# Patient Record
Sex: Female | Born: 1975 | Race: White | Hispanic: No | Marital: Married | Smoking: Former smoker
Health system: Southern US, Community
[De-identification: ages and names within clinical notes are randomized; demographics above are authoritative.]

## PROBLEM LIST (undated history)

## (undated) DIAGNOSIS — R519 Headache, unspecified: Secondary | ICD-10-CM

## (undated) DIAGNOSIS — T4145XA Adverse effect of unspecified anesthetic, initial encounter: Secondary | ICD-10-CM

## (undated) DIAGNOSIS — R51 Headache: Secondary | ICD-10-CM

## (undated) DIAGNOSIS — Z973 Presence of spectacles and contact lenses: Secondary | ICD-10-CM

## (undated) DIAGNOSIS — T8859XA Other complications of anesthesia, initial encounter: Secondary | ICD-10-CM

## (undated) DIAGNOSIS — R0600 Dyspnea, unspecified: Secondary | ICD-10-CM

## (undated) DIAGNOSIS — E669 Obesity, unspecified: Secondary | ICD-10-CM

## (undated) DIAGNOSIS — R490 Dysphonia: Secondary | ICD-10-CM

## (undated) HISTORY — PX: KNEE SURGERY: SHX244

## (undated) HISTORY — DX: Obesity, unspecified: E66.9

## (undated) HISTORY — PX: CHOLECYSTECTOMY: SHX55

## (undated) HISTORY — PX: DILATION AND CURETTAGE OF UTERUS: SHX78

## (undated) HISTORY — PX: OTHER SURGICAL HISTORY: SHX169

---

## 2009-08-19 ENCOUNTER — Emergency Department (HOSPITAL_BASED_OUTPATIENT_CLINIC_OR_DEPARTMENT_OTHER): Admission: EM | Admit: 2009-08-19 | Discharge: 2009-08-19 | Payer: Self-pay | Admitting: Emergency Medicine

## 2009-08-19 ENCOUNTER — Ambulatory Visit: Payer: Self-pay | Admitting: Diagnostic Radiology

## 2010-10-08 ENCOUNTER — Ambulatory Visit (HOSPITAL_COMMUNITY): Admission: RE | Admit: 2010-10-08 | Discharge: 2010-10-08 | Payer: Self-pay | Admitting: Internal Medicine

## 2010-11-17 ENCOUNTER — Emergency Department (HOSPITAL_COMMUNITY)
Admission: EM | Admit: 2010-11-17 | Discharge: 2010-11-17 | Payer: Self-pay | Source: Home / Self Care | Admitting: Emergency Medicine

## 2010-12-01 ENCOUNTER — Ambulatory Visit: Payer: Self-pay | Admitting: Emergency Medicine

## 2010-12-01 DIAGNOSIS — J209 Acute bronchitis, unspecified: Secondary | ICD-10-CM | POA: Insufficient documentation

## 2010-12-01 DIAGNOSIS — J45909 Unspecified asthma, uncomplicated: Secondary | ICD-10-CM | POA: Insufficient documentation

## 2011-01-08 ENCOUNTER — Ambulatory Visit (HOSPITAL_COMMUNITY)
Admission: RE | Admit: 2011-01-08 | Discharge: 2011-01-08 | Payer: Self-pay | Source: Home / Self Care | Attending: General Surgery | Admitting: General Surgery

## 2011-01-08 LAB — CBC
Hemoglobin: 13.4 g/dL (ref 12.0–15.0)
MCH: 28.9 pg (ref 26.0–34.0)
MCHC: 32.4 g/dL (ref 30.0–36.0)
MCV: 89.2 fL (ref 78.0–100.0)
Platelets: 324 10*3/uL (ref 150–400)
RBC: 4.64 MIL/uL (ref 3.87–5.11)

## 2011-01-08 LAB — COMPREHENSIVE METABOLIC PANEL
Alkaline Phosphatase: 55 U/L (ref 39–117)
BUN: 14 mg/dL (ref 6–23)
Chloride: 105 mEq/L (ref 96–112)
Creatinine, Ser: 0.82 mg/dL (ref 0.4–1.2)
Glucose, Bld: 104 mg/dL — ABNORMAL HIGH (ref 70–99)
Potassium: 4.2 mEq/L (ref 3.5–5.1)
Total Bilirubin: 0.5 mg/dL (ref 0.3–1.2)

## 2011-01-08 LAB — DIFFERENTIAL
Eosinophils Absolute: 0.7 10*3/uL (ref 0.0–0.7)
Lymphs Abs: 2.6 10*3/uL (ref 0.7–4.0)
Monocytes Absolute: 0.9 10*3/uL (ref 0.1–1.0)
Monocytes Relative: 8 % (ref 3–12)
Neutrophils Relative %: 62 % (ref 43–77)

## 2011-01-08 LAB — URINALYSIS, ROUTINE W REFLEX MICROSCOPIC
Leukocytes, UA: NEGATIVE
Nitrite: NEGATIVE
Protein, ur: NEGATIVE mg/dL
Urobilinogen, UA: 1 mg/dL (ref 0.0–1.0)

## 2011-01-08 LAB — URINE MICROSCOPIC-ADD ON

## 2011-01-16 NOTE — Assessment & Plan Note (Signed)
Summary: COLD/TM Room 4   Vital Signs:  Patient Profile:   35 Years Old Female CC:      Chest congestion, wheezing, cough x 2 weeks Height:     62 inches Weight:      205 pounds O2 Sat:      94 % O2 treatment:    Room Air Temp:     99.2 degrees F oral Pulse rate:   95 / minute Pulse rhythm:   regular Resp:     16 per minute  Vitals Entered By: Emilio Math (December 01, 2010 2:00 PM)                  Current Allergies: ! CODEINE ! DOXYCYCLINEHistory of Present Illness Chief Complaint: Chest congestion, wheezing, cough x 2 weeks History of Present Illness: Patient complains of onset of cold symptoms for 2 weeks.  She went to her PCP and got Zpak and Albuterol that she has been taking via HFA and nebulizer.  She is feeling better but still feels tight in the chest.  In the past she has used Prednisone which has helped.  She is already on Advair and Singulair.  She is on Chantix and trying to quit smoking. No sore throat + cough No pleuritic pain + wheezing No nasal congestion + post-nasal drainage + hoarseness No sinus pain/pressure No chest congestion No itchy/red eyes No earache No hemoptysis No SOB No chills/sweats No fever No nausea No vomiting No abdominal pain No diarrhea No skin rashes No fatigue No myalgias No headache   REVIEW OF SYSTEMS Constitutional Symptoms      Denies fever, chills, night sweats, weight loss, weight gain, and fatigue.  Eyes       Denies change in vision, eye pain, eye discharge, glasses, contact lenses, and eye surgery. Ear/Nose/Throat/Mouth       Complains of frequent runny nose.      Denies hearing loss/aids, change in hearing, ear pain, ear discharge, dizziness, frequent nose bleeds, sinus problems, sore throat, hoarseness, and tooth pain or bleeding.  Respiratory       Complains of dry cough, wheezing, shortness of breath, asthma, and bronchitis.      Denies productive cough and emphysema/COPD.  Cardiovascular  Denies murmurs, chest pain, and tires easily with exhertion.    Gastrointestinal       Denies stomach pain, nausea/vomiting, diarrhea, constipation, blood in bowel movements, and indigestion. Genitourniary       Denies painful urination, kidney stones, and loss of urinary control. Neurological       Denies paralysis, seizures, and fainting/blackouts. Musculoskeletal       Denies muscle pain, joint pain, joint stiffness, decreased range of motion, redness, swelling, muscle weakness, and gout.  Skin       Denies bruising, unusual mles/lumps or sores, and hair/skin or nail changes.  Psych       Denies mood changes, temper/anger issues, anxiety/stress, speech problems, depression, and sleep problems.  Past History:  Past Medical History: Asthma  Past Surgical History: Rt Knee  Family History: Mother, Factor 5 father, D, Heart problems  Social History: 1 ppd, 20 yrs ETOyes NO Drugs [facility] Physical Exam General appearance: well developed, well nourished, no acute distress Ears: normal, no lesions or deformities Nasal: mucosa pink, nonedematous, no septal deviation, turbinates normal Oral/Pharynx: tongue normal, posterior pharynx without erythema or exudate Chest/Lungs: no rales, wheezes, or rhonchi bilateral, breath sounds equal without effort Heart: regular rate and  rhythm, no murmur MSE: oriented  to time, place, and person Assessment New Problems: BRONCHITIS, ACUTE WITH BRONCHOSPASM (ICD-466.0) ASTHMA (ICD-493.90)   Plan New Medications/Changes: ZITHROMAX Z-PAK 250 MG TABS (AZITHROMYCIN) use as directed  #1 x 0, 12/01/2010, Hoyt Koch MD PREDNISONE (PAK) 10 MG TABS (PREDNISONE) 12 day pack. use as directed  #QS x 0, 12/01/2010, Hoyt Koch MD  New Orders: New Patient Level III [phone] Pulse Oximetry [94760] Planning Comments:   Continue your current allergy meds.  It is ok to repeat the Zpak to ensure that she is improved, a Rx was given that she may  choose to hold on to for a few days to see if the Prednisone is helping first.  Also have given her a 12-day Prednisone taper.  Continue your HFA and nebulizer Albuterol as needed.  If still not improving, follow up with PCP or ENT.  Nasal saline and hydration encouraged.  Patient offered but declines albuterol treatment.   The patient and/or caregiver has been counseled thoroughly with regard to medications prescribed including dosage, schedule, interactions, rationale for use, and possible side effects and they verbalize understanding.  Diagnoses and expected course of recovery discussed and will return if not improved as expected or if the condition worsens. Patient and/or caregiver verbalized understanding.  Prescriptions: ZITHROMAX Z-PAK 250 MG TABS (AZITHROMYCIN) use as directed  #1 x 0   Entered and Authorized by:   Hoyt Koch MD   Signed by:   Hoyt Koch MD on 12/01/2010   Method used:   Print then Give to Patient   RxID:   [id number] PREDNISONE (PAK) 10 MG TABS (PREDNISONE) 12 day pack. use as directed  #QS x 0   Entered and Authorized by:   Hoyt Koch MD   Signed by:   Hoyt Koch MD on 12/01/2010   Method used:   Print then Give to Patient   RxID:   [id number]   Orders Added: 1)  New Patient Level III [53664] 2)  Pulse Oximetry [40347]

## 2011-01-21 NOTE — Op Note (Signed)
Julia Boyer, Julia Boyer            ACCOUNT NO.:  [id number]  MEDICAL RECORD NO.:  [id number]          PATIENT TYPE:  AMB  LOCATION:  SDS                          FACILITY:  [facility]  PHYSICIAN:  Almond Lint, MD       DATE OF BIRTH:  August 11, 1976  DATE OF PROCEDURE:  01/08/2011 DATE OF DISCHARGE:  01/08/2011                              OPERATIVE REPORT   PREOPERATIVE DIAGNOSIS:  Chronic cholecystitis.  POSTOPERATIVE DIAGNOSIS:  Chronic cholecystitis.  PROCEDURE PERFORMED:  Laparoscopic cholecystectomy.  SURGEON:  Almond Lint, MD  ASSISTANT:  None.  ANESTHESIA:  General and local.  FINDINGS:  Inflamed gallbladder wall and numerous omental attachments to the gallbladder.  SPECIMEN:  Gallbladder to Pathology.  ESTIMATED BLOOD LOSS:  Minimal.  COMPLICATIONS:  None known.  PROCEDURE:  Ms. [name] was identified in the holding area where she was taken to the operating room and placed supine on the operating room table.  General anesthesia was induced.  Abdomen was prepped and draped in sterile fashion.  Time-out was performed according to the surgical safety check list.  When all was correct, we continued.  The infraumbilical skin was anesthetized with local anesthetic, and her prior incision from her BTL was opened up with #11 blade.  This was approximately 15 mm in length.  A Kelly clamp was used to spread the subcutaneous tissues and the midline fascia was elevated with two Kocher clamps.  This was incised with a #11 blade.  A 0 Vicryl pursestring suture was placed around the fascial incision, and the Hasson trocar was then held in place to the abdominal wall with the tails of the suture. Pneumoperitoneum was achieved through the Hasson trocar.  The patient was then placed into reverse Trendelenburg position and rotated to the left.  An 11-mm trocar was placed into the epigastrium under direct visualization after administration of local and two 5-mm trocars were placed in  the right upper quadrant after administration of local.  The gallbladder fundus was grasped and elevated toward the head.  There were omental attachments to the liver that were restrictive and so these were taken down with cautery.  The omental attachments to the gallbladder were taken down both bluntly and with cautery, taking care to stay high up on the gallbladder.  Once the omental attachments were freed up, the fundus was elevated more toward the head and the infundibulum was retracted laterally.  The cystic duct and artery were skeletonized with Art gallery manager.  Alveolar attachments were taken with the cautery and a clear critical view was obtained visualizing two tubular structures directly entering the gallbladder of appropriate caliber from both medial and lateral aspect.  The cystic duct was triply clipped on the patient's side and clipped once on the specimen side.  The cystic artery was then clipped twice on the patient's side and once on the specimen side.  This was then divided with scissors.  The gallbladder was taken off the gallbladder fossa with the cautery.  The gallbladder was retrieved from the umbilical port with an Endocatch bag. Pneumoperitoneum was reachieved, and the gallbladder fossa was examined. There was no evidence of bleeding from  the gallbladder fossa.  The clips were examined, and there was no evidence of bleeding or biliary leakage from the site of the clips.  The area was irrigated and suctioned.  A four-quadrant inspection was performed and there was no evidence of pooling of bladder or other gross pathology in the abdomen.  The gallbladder fossa was reexamined and again there was no evidence of bleeding.  The two 5-mm trocars were removed under direct visualization without evidence of bleeding from the abdominal wall.  The epigastric port was removed while suctioning the pneumoperitoneum from the abdomen. The Hasson was opened up to allow the  remainder of the air to escape and the patient was placed back into the supine position.  The local fascia was closed with a pursestring suture and there was no residual or palpable fascial defect.  The skin of all the incisions was then closed using 4-0 Monocryl in subcuticular fashion.  The wounds were then cleaned, dried, and dressed with Dermabond.  The patient was awakened from anesthesia and taken to the PACU in stable condition.  Needle and sponge counts were correct.     Almond Lint, MD     FB/MEDQ  D:  01/08/2011  T:  01/09/2011  Job:  [id number]  cc:   Philis Pique. Reuel Boom, NP Dr. Noelle Penner.  Electronically Signed by Almond Lint MD on 01/21/2011 04:48:06 PM

## 2011-01-24 ENCOUNTER — Emergency Department (HOSPITAL_COMMUNITY): Payer: 59

## 2011-01-24 ENCOUNTER — Encounter: Payer: Self-pay | Admitting: Family Medicine

## 2011-01-24 ENCOUNTER — Emergency Department (HOSPITAL_COMMUNITY)
Admission: EM | Admit: 2011-01-24 | Discharge: 2011-01-24 | Disposition: A | Discharge status: Home / Self Care | Payer: 59 | Attending: Emergency Medicine | Admitting: Emergency Medicine

## 2011-01-24 ENCOUNTER — Ambulatory Visit (INDEPENDENT_AMBULATORY_CARE_PROVIDER_SITE_OTHER): Payer: PRIVATE HEALTH INSURANCE | Admitting: Family Medicine

## 2011-01-24 ENCOUNTER — Ambulatory Visit: Payer: Self-pay | Admitting: Family Medicine

## 2011-01-24 DIAGNOSIS — S2190XA Unspecified open wound of unspecified part of thorax, initial encounter: Secondary | ICD-10-CM | POA: Insufficient documentation

## 2011-01-24 DIAGNOSIS — J209 Acute bronchitis, unspecified: Secondary | ICD-10-CM

## 2011-01-24 DIAGNOSIS — R071 Chest pain on breathing: Secondary | ICD-10-CM | POA: Insufficient documentation

## 2011-01-24 DIAGNOSIS — J45909 Unspecified asthma, uncomplicated: Secondary | ICD-10-CM

## 2011-01-24 DIAGNOSIS — R000 Tachycardia, unspecified: Secondary | ICD-10-CM

## 2011-01-24 DIAGNOSIS — R51 Headache: Secondary | ICD-10-CM | POA: Insufficient documentation

## 2011-01-24 DIAGNOSIS — R0609 Other forms of dyspnea: Secondary | ICD-10-CM | POA: Insufficient documentation

## 2011-01-24 DIAGNOSIS — R0602 Shortness of breath: Secondary | ICD-10-CM

## 2011-01-24 DIAGNOSIS — R0989 Other specified symptoms and signs involving the circulatory and respiratory systems: Secondary | ICD-10-CM | POA: Insufficient documentation

## 2011-01-24 LAB — DIFFERENTIAL
Eosinophils Relative: 1 % (ref 0–5)
Lymphocytes Relative: 19 % (ref 12–46)
Lymphs Abs: 2.7 10*3/uL (ref 0.7–4.0)
Monocytes Absolute: 1.5 10*3/uL — ABNORMAL HIGH (ref 0.1–1.0)
Monocytes Relative: 11 % (ref 3–12)

## 2011-01-24 LAB — D-DIMER, QUANTITATIVE: D-Dimer, Quant: <0.22 ug/mL-FEU (ref 0.00–0.48)

## 2011-01-24 LAB — BASIC METABOLIC PANEL
CO2: 22 mEq/L (ref 19–32)
Calcium: 8.9 mg/dL (ref 8.4–10.5)
Chloride: 108 mEq/L (ref 96–112)
Creatinine, Ser: 0.75 mg/dL (ref 0.4–1.2)
GFR calc Af Amer: >60 mL/min (ref >60)
Sodium: 140 mEq/L (ref 135–145)

## 2011-01-24 LAB — CBC
HCT: 42 % (ref 36.0–46.0)
MCHC: 32.9 g/dL (ref 30.0–36.0)
MCV: 88.4 fL (ref 78.0–100.0)
Platelets: 379 10*3/uL (ref 150–400)
RDW: 13.9 % (ref 11.5–15.5)
WBC: 14.2 10*3/uL — ABNORMAL HIGH (ref 4.0–10.5)

## 2011-01-24 MED ORDER — IOHEXOL 300 MG/ML  SOLN
100.0000 mL | Freq: Once | INTRAMUSCULAR | Status: AC | PRN
  Administered 2011-01-24: 100 mL via INTRAVENOUS

## 2011-01-30 NOTE — Assessment & Plan Note (Signed)
Summary: HEART RACING/TJ room 5   Vital Signs:  Patient Profile:   35 Years Old Female CC:      heart racing Height:     62 inches O2 Sat:      97 % O2 treatment:    Room Air Temp:     98.1 degrees F oral Pulse rate:   107 / minute Resp:     18 per minute BP sitting:   158 / 105  (left arm) Cuff size:   regular  Vitals Entered By: Clemens Catholic LPN (January 24, 2011 3:35 PM)                 Serial Vital Signs/Assessments:  Time      Position  BP       Pulse  Resp  Temp     By 1520                138/87   119                   Christy Locklear LPN                                PEF    PreRx  PostRx Time      O2 Sat  O2 Type     L/min  L/min  L/min   By 1520      97  %   2 L/min                           Christy Locklear LPN   Updated Prior Medication List: ALBUTEROL SULFATE (2.5 MG/3ML) 0.083% NEBU (ALBUTEROL SULFATE)  SINGULAIR 10 MG TABS (MONTELUKAST SODIUM)   Current Allergies (reviewed today): ! CODEINE ! DOXYCYCLINEHistory of Present Illness Chief Complaint: heart racing History of Present Illness: Patient is a 35 year old W F who had GB ssurgery 3 weeks ago and started having increase SOB over the last 3 days. She has been using her inhaler more and today she not only had the shortnesss of breath she had tachycardia as well. She states her systoluic is usuallly 120 and today it is 150 and her pulse rates is in the 60s and today it has gi=one up to 130. She denies any chest pain or cardiac problems.    Her mother has a clotting disorder and has had PE's before.  Current Problems: DYSPNEA, UNSPEC (ICD-786.05) UNSPECIFIED TACHYCARDIA (ICD-785.0) BRONCHITIS, ACUTE WITH BRONCHOSPASM (ICD-466.0) ASTHMA (ICD-493.90)   Current Meds ALBUTEROL SULFATE (2.5 MG/3ML) 0.083% NEBU (ALBUTEROL SULFATE)  SINGULAIR 10 MG TABS (MONTELUKAST SODIUM)   REVIEW OF SYSTEMS Constitutional Symptoms      Denies fever, chills, night sweats, weight loss, weight gain, and fatigue.    Eyes       Denies change in vision, eye pain, eye discharge, glasses, contact lenses, and eye surgery. Ear/Nose/Throat/Mouth       Denies hearing loss/aids, change in hearing, ear pain, ear discharge, dizziness, frequent runny nose, frequent nose bleeds, sinus problems, sore throat, hoarseness, and tooth pain or bleeding.  Respiratory       Complains of shortness of breath and asthma.      Denies dry cough, productive cough, wheezing, bronchitis, and emphysema/COPD.  Cardiovascular       Denies murmurs, chest pain, and tires easily with exhertion.      Comments: palpatations   Gastrointestinal  Complains of diarrhea.      Denies stomach pain, nausea/vomiting, constipation, blood in bowel movements, and indigestion. Genitourniary       Denies painful urination, kidney stones, and loss of urinary control. Neurological       Complains of headaches.      Denies paralysis, seizures, and fainting/blackouts. Musculoskeletal       Denies muscle pain, joint pain, joint stiffness, decreased range of motion, redness, swelling, muscle weakness, and gout.  Skin       Denies bruising, unusual mles/lumps or sores, and hair/skin or nail changes.  Psych       Denies mood changes, temper/anger issues, anxiety/stress, speech problems, depression, and sleep problems. Other Comments: pt states that she had gallbladder surgery 3 wks ago, for the last 4-5 days she has been SOB. she states that her heart has been racing for the last 2-3 days, worse today. she states that her mom has a clotting defect and she had a PE after she had gallbladder surgery and her dad had a heart transplant @ age 61.   Past History:  Family History: Last updated: 12/01/2010 Mother, Factor 5 father, D, Heart problems  Social History: Last updated: 12/01/2010 1 ppd, 20 yrs ETOyes NO Drugs [facility]  Past Medical History: Reviewed history from 12/01/2010 and no changes required. Asthma  Past Surgical History: Rt  Knee Cholecystectomy  Family History: Reviewed history from 12/01/2010 and no changes required. Mother, Factor 5 father, D, Heart problems  Social History: Reviewed history from 12/01/2010 and no changes required. 1 ppd, 20 yrs ETOyes NO Drugs [facility] Physical Exam General appearance: well developed, well nourished, anxious Head: normocephalic, atraumatic Oral/Pharynx: tongue normal, posterior pharynx without erythema or exudate Neck: neck supple,  trachea midline, no masses Chest/Lungs: no rales, wheezes, or rhonchi bilateral, breath sounds equal without effort Heart: tachycardia was present   Skin: no obvious rashes or lesions MSE: oriented to time, place, and person carotid massage attempted but seemed to increase her rate Ekg showed tacycardia Assessment New Problems: DYSPNEA, UNSPEC (ICD-786.05) UNSPECIFIED TACHYCARDIA (ICD-785.0)  tachycardia and ypsnea  Plan New Orders: New Patient Level IV [99204] EKG w/ Interpretation [93000] Planning Comments:   transfer to [facility] per patient request to possible treat tachycardia and evaluate for PE   The patient and/or caregiver has been counseled thoroughly with regard to medications prescribed including dosage, schedule, interactions, rationale for use, and possible side effects and they verbalize understanding.  Diagnoses and expected course of recovery discussed and will return if not improved as expected or if the condition worsens. Patient and/or caregiver verbalized understanding.   Patient Instructions: 1)  Transfered to [facility] 2)  EMS transported  3)  report called to charge nurse at [facility] ED  Orders Added: 1)  New Patient Level IV [99204] 2)  EKG w/ Interpretation [93000]

## 2011-05-18 ENCOUNTER — Encounter: Payer: Self-pay | Admitting: Family Medicine

## 2011-05-18 ENCOUNTER — Inpatient Hospital Stay (INDEPENDENT_AMBULATORY_CARE_PROVIDER_SITE_OTHER)
Admission: RE | Admit: 2011-05-18 | Discharge: 2011-05-18 | Disposition: A | Discharge status: Home / Self Care | Payer: 59 | Source: Ambulatory Visit | Attending: Family Medicine | Admitting: Family Medicine

## 2011-05-18 DIAGNOSIS — J45909 Unspecified asthma, uncomplicated: Secondary | ICD-10-CM

## 2011-05-18 DIAGNOSIS — J209 Acute bronchitis, unspecified: Secondary | ICD-10-CM

## 2011-05-20 ENCOUNTER — Telehealth (INDEPENDENT_AMBULATORY_CARE_PROVIDER_SITE_OTHER): Payer: Self-pay | Admitting: Emergency Medicine

## 2011-11-17 NOTE — Progress Notes (Signed)
Summary: CONGESTION/EAR ISSUES(rm4)   Vital Signs:  Patient Profile:   35 Years Old Female CC:      chest congestion/ head cold Height:     62 inches Weight:      228 pounds O2 Sat:      93 % O2 treatment:    Room Air Temp:     98.0 degrees F oral Resp:     24 per minute BP sitting:   120 / 79  (left arm) Cuff size:   regular  Vitals Entered By: Linton Flemings RN (May 18, 2011 2:30 PM)                  Prior Medication List:  ALBUTEROL SULFATE (2.5 MG/3ML) 0.083% NEBU (ALBUTEROL SULFATE)  SINGULAIR 10 MG TABS (MONTELUKAST SODIUM)    Updated Prior Medication List: ALBUTEROL SULFATE (2.5 MG/3ML) 0.083% NEBU (ALBUTEROL SULFATE)   Current Allergies (reviewed today): ! CODEINE ! DOXYCYCLINEHistory of Present Illness Chief Complaint: chest congestion/ head cold History of Present Illness: Patient has had increase congestion and cough for about 2 weeks. She states in the last 4-5 days it has gotten worse. She does smoke and when she gets this bronchitis she usually needs a Zpack and steroids.  Current Problems: BRONCHITIS, ACUTE WITH BRONCHOSPASM (ICD-466.0) BRONCHITIS, ACUTE WITH BRONCHOSPASM (ICD-466.0) ASTHMA (ICD-493.90)   Current Meds ALBUTEROL SULFATE (2.5 MG/3ML) 0.083% NEBU (ALBUTEROL SULFATE)  ZITHROMAX Z-PAK 250 MG  TABS (AZITHROMYCIN) Use as directed PREDNISONE (PAK) 10 MG TABS (PREDNISONE) sig as directed over the next 6 days  REVIEW OF SYSTEMS Constitutional Symptoms      Denies fever, chills, night sweats, weight loss, weight gain, and fatigue.  Eyes       Denies change in vision, eye pain, eye discharge, glasses, contact lenses, and eye surgery. Ear/Nose/Throat/Mouth       Complains of ear pain, frequent runny nose, and hoarseness.      Denies hearing loss/aids, ear discharge, dizziness, frequent nose bleeds, sinus problems, sore throat, and tooth pain or bleeding.  Respiratory       Complains of wheezing, shortness of breath, asthma, and bronchitis.       Denies dry cough, productive cough, and emphysema/COPD.  Cardiovascular       Denies murmurs, chest pain, and tires easily with exhertion.    Gastrointestinal       Denies stomach pain, nausea/vomiting, diarrhea, constipation, blood in bowel movements, and indigestion. Genitourniary       Denies painful urination, kidney stones, and loss of urinary control. Neurological       Complains of headaches.      Denies paralysis, seizures, and fainting/blackouts. Musculoskeletal       Denies muscle pain, joint pain, joint stiffness, decreased range of motion, redness, swelling, muscle weakness, and gout.  Skin       Denies bruising, unusual mles/lumps or sores, and hair/skin or nail changes.  Psych       Denies mood changes, temper/anger issues, anxiety/stress, speech problems, depression, and sleep problems. Other Comments: x2wk with head/chest congestion with increased use of inhaler   Past History:  Family History: Last updated: 12/01/2010 Mother, Factor 5 father, D, Heart problems  Social History: Last updated: 12/01/2010 1 ppd, 20 yrs ETOyes NO Drugs [facility]  Past Medical History: Reviewed history from 12/01/2010 and no changes required. Asthma  Past Surgical History: Reviewed history from 01/24/2011 and no changes required. Rt Knee Cholecystectomy  Family History: Reviewed history from 12/01/2010 and no changes required.  Mother, Factor 5 father, D, Heart problems  Social History: Reviewed history from 12/01/2010 and no changes required. 1 ppd, 20 yrs ETOyes NO Drugs [facility] Physical Exam General appearance: well developed, well nourished, no acute distress Head: normocephalic, atraumatic Pupils: equal, round, reactive to light Ears: inflamed right TM Nasal: pale, boggy, swollen nasal turbinates Neck: neck supple,  trachea midline, no masses Chest/Lungs: scattered wheezes throughout all lobes Heart: regular rate and  rhythm, no murmur Extremities:  normal extremities Skin: no obvious rashes or lesions MSE: oriented to time, place, and person Assessment New Problems: BRONCHITIS, ACUTE WITH BRONCHOSPASM (ICD-466.0)  bronchitis   Patient Education: Patient and/or caregiver instructed in the following: rest fluids and Tylenol, quit smoking.  Plan New Medications/Changes: PREDNISONE (PAK) 10 MG TABS (PREDNISONE) sig as directed over the next 6 days  #21 x 0, 05/18/2011, Hassan Rowan MD ZITHROMAX Z-PAK 250 MG  TABS (AZITHROMYCIN) Use as directed  #1 x 0, 05/18/2011, Hassan Rowan MD  New Orders: Est. Patient Level III [phone] Planning Comments:   as below  Follow Up: Follow up in 2-3 days if no improvement, Follow up on an as needed basis, Follow up with Primary Physician Work/School Excuse: Return to work/school tomorrow  The patient and/or caregiver has been counseled thoroughly with regard to medications prescribed including dosage, schedule, interactions, rationale for use, and possible side effects and they verbalize understanding.  Diagnoses and expected course of recovery discussed and will return if not improved as expected or if the condition worsens. Patient and/or caregiver verbalized understanding.  Prescriptions: PREDNISONE (PAK) 10 MG TABS (PREDNISONE) sig as directed over the next 6 days  #21 x 0   Entered and Authorized by:   Hassan Rowan MD   Signed by:   Hassan Rowan MD on 05/18/2011   Method used:   Printed then faxed to ...       [address] [phone]* (retail)       [address] [place], [place]  [address]       Ph: [phone]       Fax: [phone]   RxID:   [id number] ZITHROMAX Z-PAK 250 MG  TABS (AZITHROMYCIN) Use as directed  #1 x 0   Entered and Authorized by:   Hassan Rowan MD   Signed by:   Hassan Rowan MD on 05/18/2011   Method used:   Printed then faxed to ...       [address] [phone]* (retail)       [address] [place], [place]  [address]       Ph: [phone]       Fax:  [phone]   RxID:   [id number]   Patient Instructions: 1)  Please schedule a follow-up appointment as needed. 2)  Please schedule an appointment with your primary doctor in :2-3 weeks 3)  Take your antibiotic as prescribed until ALL of it is gone, but stop if you develop a rash or swelling and contact our office as soon as possible. 4)  Acute bronchitis symptoms for less than 10 days are not helped by antibiotics. take over the counter cough medications. call if no improvment in  5-7 days, sooner if increasing cough, fever, or new symptoms( shortness of breath, chest pain). 5)  Tobacco is very bad for your health and your loved ones! You Should stop smoking!. 6)  Stop Smoking Tips: Choose a Quit date. Cut down  before the Quit date. decide what you will do as a substitute when you feel the urge to smoke(gum,toothpick,exercise).  Orders Added: 1)  Est. Patient Level III [16109]

## 2011-11-17 NOTE — Telephone Encounter (Signed)
  Phone Note Outgoing Call Call back at [facility] Phone [phone]   Call placed by: Emilio Math,  May 20, 2011 11:17 AM Call placed to: Patient Summary of Call: Left msg  hope she is feeling better, call with any questions or concerns

## 2012-03-01 ENCOUNTER — Encounter: Payer: Self-pay | Admitting: Physician Assistant

## 2012-03-01 ENCOUNTER — Ambulatory Visit (INDEPENDENT_AMBULATORY_CARE_PROVIDER_SITE_OTHER): Payer: 59 | Admitting: Physician Assistant

## 2012-03-01 VITALS — BP 113/77 | HR 91 | Temp 98.6°F | Resp 16 | Ht 62.0 in | Wt 222.0 lb

## 2012-03-01 DIAGNOSIS — N943 Premenstrual tension syndrome: Secondary | ICD-10-CM

## 2012-03-01 DIAGNOSIS — Z124 Encounter for screening for malignant neoplasm of cervix: Secondary | ICD-10-CM

## 2012-03-01 DIAGNOSIS — Z1151 Encounter for screening for human papillomavirus (HPV): Secondary | ICD-10-CM

## 2012-03-01 DIAGNOSIS — N6310 Unspecified lump in the right breast, unspecified quadrant: Secondary | ICD-10-CM

## 2012-03-01 DIAGNOSIS — F3281 Premenstrual dysphoric disorder: Secondary | ICD-10-CM

## 2012-03-01 DIAGNOSIS — N63 Unspecified lump in unspecified breast: Secondary | ICD-10-CM

## 2012-03-01 DIAGNOSIS — F17200 Nicotine dependence, unspecified, uncomplicated: Secondary | ICD-10-CM

## 2012-03-01 DIAGNOSIS — N920 Excessive and frequent menstruation with regular cycle: Secondary | ICD-10-CM

## 2012-03-01 DIAGNOSIS — Z01419 Encounter for gynecological examination (general) (routine) without abnormal findings: Secondary | ICD-10-CM

## 2012-03-01 MED ORDER — SERTRALINE HCL 50 MG PO TABS: 50.0000 mg | ORAL_TABLET | Freq: Every day | ORAL | Status: DC

## 2012-03-01 MED ORDER — HYDROCODONE-IBUPROFEN 5-200 MG PO TABS: 1.0000 | ORAL_TABLET | Freq: Three times a day (TID) | ORAL | Status: AC | PRN

## 2012-03-01 MED ORDER — SERTRALINE HCL 50 MG PO TABS: 50.0000 mg | ORAL_TABLET | Freq: Every day | ORAL | Status: AC

## 2012-03-01 MED ORDER — MEDROXYPROGESTERONE ACETATE 10 MG PO TABS: 10.0000 mg | ORAL_TABLET | Freq: Every day | ORAL | Status: AC

## 2012-03-01 NOTE — Progress Notes (Signed)
Chief Complaint:  Gynecologic Exam   Julia Boyer is  36 y.o. [id number].  Patient's last menstrual period was 02/09/2012.. She presents complaining of Gynecologic Exam  Reports LEEP for abnormal pap 10 years ago with no follow-up since. States periods regular and heavy with severe cramping, passing lime-sized clots. Increasing mood swings and anger during the 3-4 days prior to period with excessive crying.   Obstetrical/Gynecological History: Pertinent Gynecological History: Menses: flow is excessive with use of 15 pads or tampons on heaviest days Bleeding: heavy with large clots, no intramenstrual bleeding Contraception: tubal ligation DES exposure: denies Blood transfusions: none Sexually transmitted diseases: no past history Previous GYN Procedures: LEEP  Last mammogram: n/a  Last pap: abnormal: s/p LEEP Date: 10 years ago   Past Medical History: Past Medical History  Diagnosis Date  . Asthma     Past Surgical History: Past Surgical History  Procedure Date  . Cholecystectomy   . Knee surgery     Rt knee  arthroscopy  . Btl     Family History: Family History  Problem Relation Age of Onset  . Diabetes Maternal Grandmother   . Heart disease Father   . Pulmonary embolism Mother     Factor V    Social History: History  Substance Use Topics  . Smoking status: Current Everyday Smoker -- 1.0 packs/day for 20 years    Types: Cigarettes  . Smokeless tobacco: Never Used  . Alcohol Use: No    Allergies:  Allergies  Allergen Reactions  . Codeine Nausea And Vomiting  . Doxycycline Nausea And Vomiting     Review of Systems - Negative except what has been reviewed in HPI  Physical Exam   Blood pressure 113/77, pulse 91, temperature 98.6 F (37 C), temperature source Oral, resp. rate 16, height 5\' 2"  (1.575 m), weight 222 lb (100.699 kg), last menstrual period 02/09/2012.  General: General appearance - alert, well appearing, and in no distress, oriented to  person, place, and time and overweight Mental status - alert, oriented to person, place, and time, normal mood, behavior, speech, dress, motor activity, and thought processes, affect appropriate to mood Eyes - left eye normal, right eye normal Nose - normal and patent, no erythema, discharge or polyps Mouth - mucous membranes moist, pharynx normal without lesions Neck - supple, no significant adenopathy Lymphatics - no palpable lymphadenopathy, no hepatosplenomegaly Chest - clear to auscultation, no wheezes, rales or rhonchi, symmetric air entry Heart - normal rate, regular rhythm, normal S1, S2, no murmurs, rubs, clicks or gallops Abdomen - soft, nontender, nondistended, no masses or organomegaly Obese with irregular striae Breasts - left breast normal without mass, skin or nipple changes or axillary nodes, right breast with solid mass 2x3 cm at 6 o'clock at [employer] Back exam - full range of motion, no tenderness, palpable spasm or pain on motion Neurological - alert, oriented, normal speech, no focal findings or movement disorder noted, screening mental status exam normal, neck supple without rigidity, cranial nerves II through XII intact Musculoskeletal - no joint tenderness, deformity or swelling Focused Gynecological Exam: VULVA: normal appearing vulva with no masses, tenderness or lesions, VAGINA: normal appearing vagina with normal color and discharge, no lesions, CERVIX: normal appearing cervix without discharge or lesions, friable with pap collection, UTERUS: non tender, unable to appreciate size due to habitus, ADNEXA: normal adnexa in size, nontender and no masses   Assessment/Plan:   1. PMDD (premenstrual dysphoric disorder)  sertraline (ZOLOFT) 50 MG tablet, DISCONTINUED: sertraline (ZOLOFT)  50 MG tablet  2. Routine gynecological examination  Cytology - PAP, TSH, CBC, Comprehensive metabolic panel, Lipid panel  3. Breast mass, right  US Breast Right, MM Digital Diagnostic Bilat  4.  Menorrhagia  US Pelvis Complete, Ultrasound non-ob transvaginal, medroxyPROGESTERone (PROVERA) 10 MG tablet, hydrocodone-ibuprofen (VICOPROFEN) 5-200 MG per tablet  5. Smoker  Consult to tobacco cessation counselor ([facility] only)     RTC fasting for labs and in 3 weeks to review results and re-eval   Julia Boyer E. 03/01/2012,11:15 AM

## 2012-03-01 NOTE — Patient Instructions (Addendum)
Pap Test A Pap test is a sampling of cells from a woman's cervix. The cervix is the opening between the vagina (birth canal) and the uterus (the bottom part of the womb). The cells are scraped from the cervix during a pelvic exam. These cells are then looked at under a microscope to see if the cells are normal or to see if a cancer is developing or there are changes that suggest a cancer will develop. Cervical dysplasia is a condition in which a woman has abnormal changes in the top layer of cells of her cervix. These changes are an early sign that cervical cancer may develop. Pap tests also look for the human papilloma virus (HPV) because it has 4 types that are responsible for 70% of cervical cancer. Infections can also be found during a Pap test such as bacteria, fungus, protozoa and viruses.  Cervical cancer is harder to treat and less likely to have a good outcome if left untreated. Catching the disease at an early stage leads to a better outcome. Since the Pap test was introduced 60 years ago, deaths from cervical cancer have decreased by 70%. Every woman should keep up to date with Pap tests. RISK FACTORS FOR CERVICAL CANCER INCLUDE:   Becoming sexually active before age 69.   Being the daughter of a woman who took diethylstilbestrol (DES) during pregnancy.   Having a sexual partner who has or has had cancer of the penis.   Having a sexual partner whose past partner had cervical cancer or cervical dysplasia (early cell changes which suggest a cancer may develop).   Having a weakened immune system. An example would be HIV or other immunodeficiency disorder.   Having had a sexually transmitted infection such as chlamydia, gonorrhea or HPV.   Having had an abnormal Pap or cancer of the vagina or vulva.   Having had more than one sexual partner.   A history of cervical cancer in a woman's sister or mother.   Not using condoms with new sexual partners.   Smoking.  WHO SHOULD HAVE PAP  TESTS  A Pap test is done to screen for cervical cancer.   The first Pap test should be done at age 50.   Between ages 33 and 77, Pap tests are repeated every 2 years.   Beginning at age 75, you are advised to have a Pap test every 3 years as long as your past 3 Pap tests have been normal.   Some women have medical problems that increase the chance of getting cervical cancer. Talk to your caregiver about these problems. It is especially important to talk to your caregiver if a new problem develops soon after your last Pap test. In these cases, your caregiver may recommend more frequent screening and Pap tests.   The above recommendations are the same for women who have or have not gotten the vaccine for HPV (Human Papillomavirus).   If you had a hysterectomy for a problem that was not a cancer or a condition that could lead to cancer, then you no longer need Pap tests. However, even if you no longer need a Pap test, a regular exam is a good idea to make sure no other problems are starting.    If you are between ages 71 and 24, and you have had normal Pap tests going back 10 years, you no longer need Pap tests. However, even if you no longer need a Pap test, a regular exam is a good idea  to make sure no other problems are starting.    If you have had past treatment for cervical cancer or a condition that could lead to cancer, you need Pap tests and screening for cancer for at least 20 years after your treatment.   If Pap tests have been discontinued, risk factors (such as a new sexual partner) need to be re-assessed to determine if screening should be resumed.   Some women may need screenings more often if they are at high risk for cervical cancer.  PREPARATION FOR A PAP TEST A Pap test should be performed during the weeks before the start of menstruation. Women should not douche or have sexual intercourse for 24 hours before the test. No vaginal creams, diaphragms, or tampons should be  used for 24 hours before the test. To minimize discomfort, a woman should empty her bladder just before the exam. TAKING THE PAP TEST The caregiver will perform a pelvic exam. A metal or plastic instrument (speculum) is placed in the vagina. This is done before your caregiver does a bimanual exam of your internal female organs. This instrument allows your caregiver to see the inside of the vagina and look at the cervix. A small, sterile brush is used to take a sample of cells from the internal opening of the cervix. A small wooden spatula is used to scrape the outside of the cervix. Neither of these two methods to collect cells will cause you pain. These two scrapings are placed on a glass slide or in a small bottle filled with a special liquid. The cells are looked at later under a microscope in a lab. A specialist will look at these cells and determine if the cells are normal. RESULTS OF YOUR PAP TEST  A healthy Pap test shows no abnormal cells or evidence of inflammation.   The presence of abnormally growing cells on the surface of the cervix may be reported as an abnormal Pap test. Different categories of findings are used to describe your Pap test. Your caregiver will go over the importance of these findings with you. The caregiver will then determine what follow-up is needed or when you should have your next pap test.   If you have had two or more abnormal Pap tests:   You may be asked to have a colposcopy. This is a test in which the cervix is viewed with a special lighted microscope.   A cervical tissue sample (biopsy) may also be needed. This involves taking a small tissue sample from the cervix. The sample is looked at under a microscope to find the cause of the abnormal cells. Make sure you find out the results of the Pap test. If you have not received the results within two weeks, contact your caregiver's office for the results. Do not assume everything is normal if you have not heard from  your caregiver or medical facility. It is important to follow up on all of your test results.  Document Released: 02/21/2003 Document Revised: 11/20/2011 Document Reviewed: 11/25/2011 [facility] Patient Information 2012 [place], [place].Smoking Cessation This document explains the best ways for you to quit smoking and new treatments to help. It lists new medicines that can double or triple your chances of quitting and quitting for good. It also considers ways to avoid relapses and concerns you may have about quitting, including weight gain. NICOTINE: A POWERFUL ADDICTION If you have tried to quit smoking, you know how hard it can be. It is hard because nicotine is a  very addictive drug. For some people, it can be as addictive as heroin or cocaine. Usually, people make 2 or 3 tries, or more, before finally being able to quit. Each time you try to quit, you can learn about what helps and what hurts. Quitting takes hard work and a lot of effort, but you can quit smoking. QUITTING SMOKING IS ONE OF THE MOST IMPORTANT THINGS YOU WILL EVER DO.  You will live longer, feel better, and live better.   The impact on your body of quitting smoking is felt almost immediately:   Within 20 minutes, blood pressure decreases. Pulse returns to its normal level.   After 8 hours, carbon monoxide levels in the blood return to normal. Oxygen level increases.   After 24 hours, chance of heart attack starts to decrease. Breath, hair, and body stop smelling like smoke.   After 48 hours, damaged nerve endings begin to recover. Sense of taste and smell improve.   After 72 hours, the body is virtually free of nicotine. Bronchial tubes relax and breathing becomes easier.   After 2 to 12 weeks, lungs can hold more air. Exercise becomes easier and circulation improves.   Quitting will reduce your risk of having a heart attack, stroke, cancer, or lung disease:   After 1 year, the risk of coronary heart disease is cut in half.    After 5 years, the risk of stroke falls to the same as a nonsmoker.   After 10 years, the risk of lung cancer is cut in half and the risk of other cancers decreases significantly.   After 15 years, the risk of coronary heart disease drops, usually to the level of a nonsmoker.   If you are pregnant, quitting smoking will improve your chances of having a healthy baby.   The people you live with, especially your children, will be healthier.   You will have extra money to spend on things other than cigarettes.  FIVE KEYS TO QUITTING Studies have shown that these 5 steps will help you quit smoking and quit for good. You have the best chances of quitting if you use them together: 1. Get ready.  2. Get support and encouragement.  3. Learn new skills and behaviors.  4. Get medicine to reduce your nicotine addiction and use it correctly.  5. Be prepared for relapse or difficult situations. Be determined to continue trying to quit, even if you do not succeed at first.  1. GET READY  Set a quit date.   Change your environment.   Get rid of ALL cigarettes, ashtrays, matches, and lighters in your home, car, and place of work.   Do not let people smoke in your home.   Review your past attempts to quit. Think about what worked and what did not.   Once you quit, do not smoke. NOT EVEN A PUFF!  2. GET SUPPORT AND ENCOURAGEMENT Studies have shown that you have a better chance of being successful if you have help. You can get support in many ways.  Tell your family, friends, and coworkers that you are going to quit and need their support. Ask them not to smoke around you.   Talk to your caregivers (doctor, dentist, nurse, pharmacist, psychologist, and/or smoking counselor).   Get individual, group, or telephone counseling and support. The more counseling you have, the better your chances are of quitting. Programs are available at [employer] and health centers. Call your local health  department for information about programs  in your area.   Spiritual beliefs and practices may help some smokers quit.   Quit meters are Photographer that keep track of quit statistics, such as amount of "quit-time," cigarettes not smoked, and money saved.   Many smokers find one or more of the many self-help books available useful in helping them quit and stay off tobacco.  3. LEARN NEW SKILLS AND BEHAVIORS  Try to distract yourself from urges to smoke. Talk to someone, go for a walk, or occupy your time with a task.   When you first try to quit, change your routine. Take a different route to work. Drink tea instead of coffee. Eat breakfast in a different place.   Do something to reduce your stress. Take a hot bath, exercise, or read a book.   Plan something enjoyable to do every day. Reward yourself for not smoking.   Explore interactive web-based programs that specialize in helping you quit.  4. GET MEDICINE AND USE IT CORRECTLY Medicines can help you stop smoking and decrease the urge to smoke. Combining medicine with the above behavioral methods and support can quadruple your chances of successfully quitting smoking. The U[place]. Food and Drug Administration (FDA) has approved 7 medicines to help you quit smoking. These medicines fall into 3 categories.  Nicotine replacement therapy (delivers nicotine to your body without the negative effects and risks of smoking):   Nicotine gum: Available over-the-counter.   Nicotine lozenges: Available over-the-counter.   Nicotine inhaler: Available by prescription.   Nicotine nasal spray: Available by prescription.   Nicotine skin patches (transdermal): Available by prescription and over-the-counter.   Antidepressant medicine (helps people abstain from smoking, but how this works is unknown):   Bupropion sustained-release (SR) tablets: Available by prescription.   Nicotinic receptor partial agonist  (simulates the effect of nicotine in your brain):   Varenicline tartrate tablets: Available by prescription.   Ask your caregiver for advice about which medicines to use and how to use them. Carefully read the information on the package.   Everyone who is trying to quit may benefit from using a medicine. If you are pregnant or trying to become pregnant, nursing an infant, you are under age 8, or you smoke fewer than 10 cigarettes per day, talk to your caregiver before taking any nicotine replacement medicines.   You should stop using a nicotine replacement product and call your caregiver if you experience nausea, dizziness, weakness, vomiting, fast or irregular heartbeat, mouth problems with the lozenge or gum, or redness or swelling of the skin around the patch that does not go away.   Do not use any other product containing nicotine while using a nicotine replacement product.   Talk to your caregiver before using these products if you have diabetes, heart disease, asthma, stomach ulcers, you had a recent heart attack, you have high blood pressure that is not controlled with medicine, a history of irregular heartbeat, or you have been prescribed medicine to help you quit smoking.  5. BE PREPARED FOR RELAPSE OR DIFFICULT SITUATIONS  Most relapses occur within the first 3 months after quitting. Do not be discouraged if you start smoking again. Remember, most people try several times before they finally quit.   You may have symptoms of withdrawal because your body is used to nicotine. You may crave cigarettes, be irritable, feel very hungry, cough often, get headaches, or have difficulty concentrating.   The withdrawal symptoms are only temporary. They are strongest when you  first quit, but they will go away within 10 to 14 days.  Here are some difficult situations to watch for:  Alcohol. Avoid drinking alcohol. Drinking lowers your chances of successfully quitting.   Caffeine. Try to reduce  the amount of caffeine you consume. It also lowers your chances of successfully quitting.   Other smokers. Being around smoking can make you want to smoke. Avoid smokers.   Weight gain. Many smokers will gain weight when they quit, usually less than 10 pounds. Eat a healthy diet and stay active. Do not let weight gain distract you from your main goal, quitting smoking. Some medicines that help you quit smoking may also help delay weight gain. You can always lose the weight gained after you quit.   Bad mood or depression. There are a lot of ways to improve your mood other than smoking.  If you are having problems with any of these situations, talk to your caregiver. SPECIAL SITUATIONS AND CONDITIONS Studies suggest that everyone can quit smoking. Your situation or condition can give you a special reason to quit.  Pregnant women/new mothers: By quitting, you protect your baby's health and your own.   Hospitalized patients: By quitting, you reduce health problems and help healing.   Heart attack patients: By quitting, you reduce your risk of a second heart attack.   Lung, head, and neck cancer patients: By quitting, you reduce your chance of a second cancer.   Parents of children and adolescents: By quitting, you protect your children from illnesses caused by secondhand smoke.  QUESTIONS TO THINK ABOUT Think about the following questions before you try to stop smoking. You may want to talk about your answers with your caregiver.  Why do you want to quit?   If you tried to quit in the past, what helped and what did not?   What will be the most difficult situations for you after you quit? How will you plan to handle them?   Who can help you through the tough times? Your family? Friends? Caregiver?   What pleasures do you get from smoking? What ways can you still get pleasure if you quit?  Here are some questions to ask your caregiver:  How can you help me to be successful at quitting?     What medicine do you think would be best for me and how should I take it?   What should I do if I need more help?   What is smoking withdrawal like? How can I get information on withdrawal?  Quitting takes hard work and a lot of effort, but you can quit smoking. FOR MORE INFORMATION  Smokefree.gov (http://www.davis-sullivan.com/) provides free, accurate, evidence-based information and professional assistance to help support the immediate and long-term needs of people trying to quit smoking. Document Released: 11/25/2001 Document Revised: 11/20/2011 Document Reviewed: 09/17/2009 [facility] Patient Information 2012 [place], [place].Menorrhagia Dysfunctional uterine bleeding is different from a normal menstrual period. When periods are heavy or there is more bleeding than is usual for you, it is called menorrhagia. It may be caused by hormonal imbalance, or physical, metabolic, or other problems. Examination is necessary in order that your caregiver may treat treatable causes. If this is a continuing problem, a D&C may be needed. That means that the cervix (the opening of the uterus or womb) is dilated (stretched larger) and the lining of the uterus is scraped out. The tissue scraped out is then examined under a microscope by a specialist (pathologist) to make sure there  is nothing of concern that needs further or more extensive treatment. HOME CARE INSTRUCTIONS   If medications were prescribed, take exactly as directed. Do not change or switch medications without consulting your caregiver.   Long term heavy bleeding may result in iron deficiency. Your caregiver may have prescribed iron pills. They help replace the iron your body lost from heavy bleeding. Take exactly as directed. Iron may cause constipation. If this becomes a problem, increase the bran, fruits, and roughage in your diet.   Do not take aspirin or medicines that contain aspirin one week before or during your menstrual period. Aspirin may  make the bleeding worse.   If you need to change your sanitary pad or tampon more than once every 2 hours, stay in bed and rest as much as possible until the bleeding stops.   Eat well-balanced meals. Eat foods high in iron. Examples are leafy green vegetables, meat, liver, eggs, and whole grain breads and cereals. Do not try to lose weight until the abnormal bleeding has stopped and your blood iron level is back to normal.  SEEK MEDICAL CARE IF:   You need to change your sanitary pad or tampon more than once an hour.   You develop nausea (feeling sick to your stomach) and vomiting, dizziness, or diarrhea while you are taking your medicine.   You have any problems that may be related to the medicine you are taking.  SEEK IMMEDIATE MEDICAL CARE IF:   You have a fever.   You develop chills.   You develop severe bleeding or start to pass blood clots.   You feel dizzy or faint.  MAKE SURE YOU:   Understand these instructions.   Will watch your condition.   Will get help right away if you are not doing well or get worse.  Document Released: 12/01/2005 Document Revised: 11/20/2011 Document Reviewed: 07/21/2008 [facility] Patient Information 2012 [place], [place].

## 2012-03-02 ENCOUNTER — Other Ambulatory Visit: Payer: 59

## 2012-03-02 NOTE — Progress Notes (Signed)
Addended by: Granville Lewis on: 03/02/2012 10:46 AM   Modules accepted: Orders

## 2012-03-05 ENCOUNTER — Ambulatory Visit (HOSPITAL_COMMUNITY)
Admission: RE | Admit: 2012-03-05 | Discharge: 2012-03-05 | Disposition: A | Discharge status: Home / Self Care | Payer: 59 | Source: Ambulatory Visit | Attending: Physician Assistant | Admitting: Physician Assistant

## 2012-03-05 ENCOUNTER — Other Ambulatory Visit: Payer: Self-pay | Admitting: Physician Assistant

## 2012-03-05 ENCOUNTER — Ambulatory Visit
Admission: RE | Admit: 2012-03-05 | Discharge: 2012-03-05 | Disposition: A | Discharge status: Home / Self Care | Payer: 59 | Source: Ambulatory Visit | Attending: Physician Assistant | Admitting: Physician Assistant

## 2012-03-05 DIAGNOSIS — N920 Excessive and frequent menstruation with regular cycle: Secondary | ICD-10-CM | POA: Insufficient documentation

## 2012-03-05 DIAGNOSIS — N6310 Unspecified lump in the right breast, unspecified quadrant: Secondary | ICD-10-CM

## 2012-03-05 DIAGNOSIS — D250 Submucous leiomyoma of uterus: Secondary | ICD-10-CM | POA: Insufficient documentation

## 2012-03-09 ENCOUNTER — Encounter: Payer: 59 | Admitting: Obstetrics & Gynecology

## 2012-03-24 ENCOUNTER — Ambulatory Visit: Payer: 59 | Admitting: Obstetrics & Gynecology

## 2012-04-06 ENCOUNTER — Ambulatory Visit: Payer: 59 | Admitting: Obstetrics & Gynecology

## 2012-04-06 DIAGNOSIS — N943 Premenstrual tension syndrome: Secondary | ICD-10-CM

## 2012-06-04 ENCOUNTER — Emergency Department
Admission: EM | Admit: 2012-06-04 | Discharge: 2012-06-04 | Disposition: A | Discharge status: Home Health | Payer: Self-pay | Source: Home / Self Care

## 2012-06-04 MED ORDER — TUBERCULIN PPD 5 UNIT/0.1ML ID SOLN
5.0000 [IU] | Freq: Once | INTRADERMAL | Status: AC
  Administered 2012-06-04: 5 [IU] via INTRADERMAL

## 2012-06-04 NOTE — ED Notes (Signed)
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

## 2012-06-07 ENCOUNTER — Telehealth: Payer: Self-pay

## 2012-06-07 ENCOUNTER — Emergency Department
Admission: EM | Admit: 2012-06-07 | Discharge: 2012-06-07 | Disposition: A | Discharge status: Home / Self Care | Payer: 59 | Source: Home / Self Care

## 2012-06-07 NOTE — ED Notes (Signed)
Noya present to have her PPD read. She has a negative PPD test with 0 mm.

## 2012-06-25 ENCOUNTER — Encounter: Payer: Self-pay | Admitting: Emergency Medicine

## 2012-06-25 ENCOUNTER — Emergency Department
Admission: EM | Admit: 2012-06-25 | Discharge: 2012-06-25 | Disposition: A | Discharge status: Home / Self Care | Payer: Self-pay | Source: Home / Self Care

## 2012-06-25 DIAGNOSIS — Z111 Encounter for screening for respiratory tuberculosis: Secondary | ICD-10-CM

## 2012-06-25 MED ORDER — TUBERCULIN PPD 5 UNIT/0.1ML ID SOLN
5.0000 [IU] | Freq: Once | INTRADERMAL | Status: AC
  Administered 2012-06-25: 5 [IU] via INTRADERMAL

## 2012-06-25 NOTE — ED Notes (Signed)
Here for 2nd PPD per protocol [facility]. 1st one 06-04-12 was negative.

## 2012-06-28 ENCOUNTER — Emergency Department
Admission: EM | Admit: 2012-06-28 | Discharge: 2012-06-28 | Disposition: A | Discharge status: Home / Self Care | Payer: Self-pay | Source: Home / Self Care

## 2012-06-29 ENCOUNTER — Encounter: Payer: Self-pay | Admitting: *Deleted

## 2012-06-29 NOTE — ED Notes (Signed)
The pt is here to have her PPD read. Negative 00mm.

## 2013-06-10 ENCOUNTER — Ambulatory Visit: Payer: Self-pay | Admitting: Family

## 2013-06-24 ENCOUNTER — Encounter: Payer: Self-pay | Admitting: Family

## 2013-06-24 ENCOUNTER — Ambulatory Visit (INDEPENDENT_AMBULATORY_CARE_PROVIDER_SITE_OTHER): Payer: PRIVATE HEALTH INSURANCE | Admitting: Family

## 2013-06-24 VITALS — BP 126/87 | HR 85 | Resp 16 | Ht 62.0 in | Wt 225.0 lb

## 2013-06-24 DIAGNOSIS — N920 Excessive and frequent menstruation with regular cycle: Secondary | ICD-10-CM

## 2013-06-24 NOTE — Progress Notes (Signed)
  Subjective:    Patient ID: Julia Boyer, female    DOB: 1976-04-14, 37 y.o.   MRN: [id number]  HPI  Pt is here with report of heavy menstrual cycles and dysmenorrhea.  Cycle occurs every 28 days and lasts approximately 7 days.  Pt reports that on day 3-5 she needs to change pad every 1.5 hours.  Currently smokes a pack of cigarettes per day.    Review of Systems  Genitourinary: Positive for menstrual problem.  All other systems reviewed and are negative.       Objective:   Physical Exam  Constitutional: She is oriented to person, place, and time. She appears well-developed and well-nourished. No distress.  HENT:  Head: Normocephalic and atraumatic.  Neck: Normal range of motion. Neck supple. No thyromegaly present.  Cardiovascular: Normal rate and regular rhythm.   Pulmonary/Chest: Effort normal and breath sounds normal.  Abdominal: Soft. Bowel sounds are normal. There is no tenderness.  Neurological: She is alert and oriented to person, place, and time.  Skin: Skin is warm and dry.          Assessment & Plan:  Menorrhagia  Plan: Schedule appt for Mirena IUD and Well Woman Exam (pap).

## 2013-06-30 ENCOUNTER — Ambulatory Visit: Payer: PRIVATE HEALTH INSURANCE | Admitting: Obstetrics & Gynecology

## 2013-07-13 ENCOUNTER — Ambulatory Visit (INDEPENDENT_AMBULATORY_CARE_PROVIDER_SITE_OTHER): Payer: PRIVATE HEALTH INSURANCE | Admitting: Obstetrics & Gynecology

## 2013-07-13 ENCOUNTER — Encounter: Payer: Self-pay | Admitting: Obstetrics & Gynecology

## 2013-07-13 VITALS — BP 123/76 | HR 98 | Resp 16 | Ht 62.0 in | Wt 229.0 lb

## 2013-07-13 DIAGNOSIS — N920 Excessive and frequent menstruation with regular cycle: Secondary | ICD-10-CM

## 2013-07-13 DIAGNOSIS — Z30430 Encounter for insertion of intrauterine contraceptive device: Secondary | ICD-10-CM

## 2013-07-13 DIAGNOSIS — Z01812 Encounter for preprocedural laboratory examination: Secondary | ICD-10-CM

## 2013-07-13 MED ORDER — LEVONORGESTREL 20 MCG/24HR IU IUD
INTRAUTERINE_SYSTEM | Freq: Once | INTRAUTERINE | Status: AC
  Administered 2013-07-13: 10:00:00 via INTRAUTERINE

## 2013-07-13 NOTE — Addendum Note (Signed)
Addended by: Granville Lewis on: 07/13/2013 10:06 AM   Modules accepted: Orders

## 2013-07-13 NOTE — Progress Notes (Signed)
  Subjective:    Patient ID: Julia Boyer, female    DOB: 1976-10-07, 37 y.o.   MRN: [id number]  HPI  89 yo P2 (daughters 93 and 59 yo) here for Mirena insertion to treat her menorrhagia.    Review of Systems     Objective:   Physical Exam  UPT negative, consent signed, Time out procedure done. Cervix prepped with betadine and grasped with a single tooth tenaculum. Mirena was easily placed and the strings were cut to 3-4 cm. Uterus sounded to 9 cm. She tolerated the procedure well.       Assessment & Plan:   Menorrhagia- check TSH and CBC Mirena placed. Schedule annual/string check

## 2013-07-14 ENCOUNTER — Telehealth: Payer: Self-pay | Admitting: *Deleted

## 2013-07-14 LAB — CBC
MCV: 84.6 fL (ref 78.0–100.0)
Platelets: 383 10*3/uL (ref 150–400)
RDW: 14.3 % (ref 11.5–15.5)
WBC: 8.2 10*3/uL (ref 4.0–10.5)

## 2013-07-14 NOTE — Telephone Encounter (Signed)
Pt notified of normal labs done yesterday.

## 2013-08-10 ENCOUNTER — Ambulatory Visit (INDEPENDENT_AMBULATORY_CARE_PROVIDER_SITE_OTHER): Payer: PRIVATE HEALTH INSURANCE | Admitting: Obstetrics & Gynecology

## 2013-08-10 ENCOUNTER — Encounter: Payer: Self-pay | Admitting: Obstetrics & Gynecology

## 2013-08-10 VITALS — BP 115/97 | HR 97 | Resp 16 | Ht 65.0 in | Wt 230.0 lb

## 2013-08-10 DIAGNOSIS — Z124 Encounter for screening for malignant neoplasm of cervix: Secondary | ICD-10-CM

## 2013-08-10 DIAGNOSIS — Z0000 Encounter for general adult medical examination without abnormal findings: Secondary | ICD-10-CM

## 2013-08-10 DIAGNOSIS — Z01419 Encounter for gynecological examination (general) (routine) without abnormal findings: Secondary | ICD-10-CM

## 2013-08-10 DIAGNOSIS — Z1151 Encounter for screening for human papillomavirus (HPV): Secondary | ICD-10-CM

## 2013-08-10 DIAGNOSIS — N920 Excessive and frequent menstruation with regular cycle: Secondary | ICD-10-CM

## 2013-08-10 NOTE — Progress Notes (Signed)
Subjective:    Julia Boyer is a 37 y.o. female who presents for an annual exam. She complains of being "emotional and moody" for the last week. She does say that she has a lot of things on her plate right now, not sleeping. She finished college and is looking for a job. This is a new onset problem. She has had one period since the Mirena was placed. It was lighter than in the past. The patient is sexually active. GYN screening history: last pap: was normal. The patient wears seatbelts: yes. The patient participates in regular exercise: occasionally. Has the patient ever been transfused or tattooed?: yes. (tattoo)  The patient reports that there is not domestic violence in her life.   Menstrual History: OB History   Grav Para Term Preterm Abortions TAB SAB Ect Mult Living   2 2 2       2       Menarche age: 80 [place]: 74   Patient's last menstrual period was 07/08/2013.    The following portions of the patient's history were reviewed and updated as appropriate: allergies, current medications, past family history, past medical history, past social history, past surgical history and problem list.  Review of Systems A comprehensive review of systems was negative. 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 She had a mammogram and u/s 2013 (normal).   Objective:    BP 115/97  Pulse 97  Resp 16  Ht 5\' 5"  (1.651 m)  Wt 230 lb (104.327 kg)  BMI 38.27 kg/m2  LMP 07/08/2013  General Appearance:    Alert, cooperative, no distress, appears stated age  Head:    Normocephalic, without obvious abnormality, atraumatic  Eyes:    PERRL, conjunctiva/corneas clear, EOM's intact, fundi    benign, both eyes  Ears:    Normal TM's and external ear canals, both ears  Nose:   Nares normal, septum midline, mucosa normal, no drainage    or sinus tenderness  Throat:   Lips, mucosa, and tongue normal; teeth and gums normal  Neck:   Supple, symmetrical, trachea  midline, no adenopathy;    thyroid:  no enlargement/tenderness/nodules; no carotid   bruit or JVD  Back:     Symmetric, no curvature, ROM normal, no CVA tenderness  Lungs:     Clear to auscultation bilaterally, respirations unlabored  Chest Wall:    No tenderness or deformity   Heart:    Regular rate and rhythm, S1 and S2 normal, no murmur, rub   or gallop  Breast Exam:    No tenderness, masses, or nipple abnormality  Abdomen:     Soft, non-tender, bowel sounds active all four quadrants,    no masses, no organomegaly  Genitalia:    Normal female without lesion, discharge or tenderness, IUD string seen, NSSA, NT, normal adnexal exam     Extremities:   Extremities normal, atraumatic, no cyanosis or edema  Pulses:   2+ and symmetric all extremities  Skin:   Skin color, texture, turgor normal, no rashes or lesions  Lymph nodes:   Cervical, supraclavicular, and axillary nodes normal  Neurologic:   CNII-XII intact, normal strength, sensation and reflexes    throughout  .    Assessment:    Healthy female exam.    Plan:     Breast self exam technique reviewed and patient encouraged to perform self-exam monthly.  Thin prep pap with cotesting Fasting lipids today, comp meta

## 2013-08-10 NOTE — Addendum Note (Signed)
Addended by: Granville Lewis on: 08/10/2013 12:49 PM   Modules accepted: Orders

## 2013-08-11 ENCOUNTER — Telehealth: Payer: Self-pay | Admitting: *Deleted

## 2013-08-11 LAB — CBC
HCT: 39 % (ref 36.0–46.0)
Hemoglobin: 12.8 g/dL (ref 12.0–15.0)
MCH: 27.2 pg (ref 26.0–34.0)
MCHC: 32.8 g/dL (ref 30.0–36.0)
MCV: 83 fL (ref 78.0–100.0)

## 2013-08-11 LAB — COMPREHENSIVE METABOLIC PANEL
AST: 13 U/L (ref 0–37)
Albumin: 4.1 g/dL (ref 3.5–5.2)
Alkaline Phosphatase: 58 U/L (ref 39–117)
BUN: 11 mg/dL (ref 6–23)
Creat: 0.74 mg/dL (ref 0.50–1.10)
Potassium: 3.9 mEq/L (ref 3.5–5.3)

## 2013-08-11 LAB — LIPID PANEL
LDL Cholesterol: 97 mg/dL (ref 0–99)
VLDL: 23 mg/dL (ref 0–40)

## 2013-08-11 LAB — TSH: TSH: 3.008 u[IU]/mL (ref 0.350–4.500)

## 2013-08-11 NOTE — Telephone Encounter (Signed)
Copy of labs mailed to pt's home address. 

## 2014-01-05 ENCOUNTER — Ambulatory Visit (INDEPENDENT_AMBULATORY_CARE_PROVIDER_SITE_OTHER): Payer: PRIVATE HEALTH INSURANCE | Admitting: Obstetrics & Gynecology

## 2014-01-05 DIAGNOSIS — N925 Other specified irregular menstruation: Secondary | ICD-10-CM

## 2014-01-05 DIAGNOSIS — N949 Unspecified condition associated with female genital organs and menstrual cycle: Secondary | ICD-10-CM

## 2014-01-05 DIAGNOSIS — N938 Other specified abnormal uterine and vaginal bleeding: Secondary | ICD-10-CM

## 2014-01-05 MED ORDER — MISOPROSTOL 200 MCG PO TABS: ORAL_TABLET | ORAL | Status: DC

## 2014-01-05 NOTE — Progress Notes (Signed)
   Subjective:    Patient ID: Julia Boyer, female    DOB: 06-Apr-1976, 38 y.o.   MRN: [id number]  HPI 38 yo with Mirena placed 7/14 is here today because of continued heavy irregular bleeding. She had the Mirena placed because of DUB. She has had a BTL in the past. She would like to have the Mirena removed and have an endometrial ablation. We had discussed this option at the time of her Mirena placement.   Review of Systems     Objective:   Physical Exam        Assessment & Plan:  DUB- no response with Mirena. Plan for EMBX. Pretreat with cytotec. Schedule for Novasure.

## 2014-01-05 NOTE — Patient Instructions (Signed)
Endometrial Biopsy Endometrial biopsy is a procedure in which a tissue sample is taken from inside the uterus. The tissue sample is then looked at under a microscope to see if the tissue is normal or abnormal. The endometrium is the lining of the uterus. This procedure helps determine where you are in your menstrual cycle and how hormone levels are affecting the lining of the uterus. This procedure may also be used to evaluate uterine bleeding or to diagnose endometrial cancer, tuberculosis, polyps, or inflammatory conditions.  LET [facility] CARE PROVIDER KNOW ABOUT:  Any allergies you have.  All medicines you are taking, including vitamins, herbs, eye drops, creams, and over-the-counter medicines.  Previous problems you or members of your family have had with the use of anesthetics.  Any blood disorders you have.  Previous surgeries you have had.  Medical conditions you have.  Possibility of pregnancy. RISKS AND COMPLICATIONS Generally, this is a safe procedure. However, as with any procedure, complications can occur. Possible complications include:  Bleeding.  Pelvic infection.  Puncture of the uterine wall with the biopsy device (rare). BEFORE THE PROCEDURE   Keep a record of your menstrual cycles as directed by your health care provider. You may need to schedule your procedure for a specific time in your cycle.  You may want to bring a sanitary pad to wear home after the procedure.  Arrange for someone to drive you home after the procedure if you will be given a medicine to help you relax (sedative). PROCEDURE   You may be given a sedative to relax you.  You will lie on an exam table with your feet and legs supported as in a pelvic exam.  Your health care provider will insert an instrument (speculum) into your vagina to see your cervix.  Your cervix will be cleansed with an antiseptic solution. A medicine (local anesthetic) will be used to numb the cervix.  A forceps  instrument (tenaculum) will be used to hold your cervix steady for the biopsy.  A thin, rodlike instrument (uterine sound) will be inserted through your cervix to determine the length of your uterus and the location where the biopsy sample will be removed.  A thin, flexible tube (catheter) will be inserted through your cervix and into the uterus. The catheter is used to collect the biopsy sample from your endometrial tissue.  The catheter and speculum will then be removed, and the tissue sample will be sent to a lab for examination. AFTER THE PROCEDURE  You will rest in a recovery area until you are ready to go home.  You may have mild cramping and a small amount of vaginal bleeding for a few days after the procedure. This is normal.  Make sure you find out how to get your test results. Document Released: 04/03/2005 Document Revised: 08/03/2013 Document Reviewed: 05/18/2013 [facility] Patient Information 2014 [place], [place].

## 2014-01-17 ENCOUNTER — Encounter (HOSPITAL_COMMUNITY): Payer: Self-pay | Admitting: Pharmacist

## 2014-01-18 ENCOUNTER — Other Ambulatory Visit: Payer: PRIVATE HEALTH INSURANCE | Admitting: Obstetrics & Gynecology

## 2014-01-24 ENCOUNTER — Other Ambulatory Visit: Payer: PRIVATE HEALTH INSURANCE | Admitting: Obstetrics & Gynecology

## 2014-01-30 ENCOUNTER — Encounter (HOSPITAL_COMMUNITY): Admission: RE | Payer: Self-pay | Source: Ambulatory Visit

## 2014-01-30 ENCOUNTER — Ambulatory Visit (HOSPITAL_COMMUNITY)
Admission: RE | Admit: 2014-01-30 | Payer: PRIVATE HEALTH INSURANCE | Source: Ambulatory Visit | Admitting: Obstetrics & Gynecology

## 2014-01-30 SURGERY — NOVASURE ABLATION
Anesthesia: Choice | Site: Vagina

## 2014-02-09 ENCOUNTER — Other Ambulatory Visit: Payer: PRIVATE HEALTH INSURANCE | Admitting: Obstetrics & Gynecology

## 2014-03-02 ENCOUNTER — Ambulatory Visit (INDEPENDENT_AMBULATORY_CARE_PROVIDER_SITE_OTHER): Payer: PRIVATE HEALTH INSURANCE | Admitting: Obstetrics & Gynecology

## 2014-03-02 ENCOUNTER — Encounter: Payer: Self-pay | Admitting: Obstetrics & Gynecology

## 2014-03-02 VITALS — BP 122/68 | HR 95 | Resp 16 | Ht 65.0 in | Wt 221.0 lb

## 2014-03-02 DIAGNOSIS — N949 Unspecified condition associated with female genital organs and menstrual cycle: Secondary | ICD-10-CM

## 2014-03-02 DIAGNOSIS — Z01812 Encounter for preprocedural laboratory examination: Secondary | ICD-10-CM

## 2014-03-02 DIAGNOSIS — N938 Other specified abnormal uterine and vaginal bleeding: Secondary | ICD-10-CM

## 2014-03-02 LAB — POCT URINE PREGNANCY: Preg Test, Ur: NEGATIVE

## 2014-03-02 NOTE — Progress Notes (Signed)
   Subjective:    Patient ID: Julia Boyer, female    DOB: 1976/04/26, 38 y.o.   MRN: [id number]  HPI  38 yo lady here for [facility] to evaluate DUB. She has had a BTL but now has a Mirena to treat her menorrhagia. She took her cytotec last night.   Review of Systems     Objective:   Physical Exam Her Mirena was half way out of the os when I placed the speculum in. I removed it and noted that it was intact.  UPT negative, consent signed, time out done Cervix prepped with betadine and grasped with a single tooth tenaculum Uterus sounded to 9 cm Pipelle used for 2 passes with a moderate amount of tissue obtained. She tolerated the procedure well.        Assessment & Plan:  DUB- await EMBX RTC 1-2 weeks for results

## 2014-03-07 ENCOUNTER — Telehealth: Payer: Self-pay | Admitting: *Deleted

## 2014-03-08 NOTE — Telephone Encounter (Signed)
Error

## 2014-03-09 ENCOUNTER — Ambulatory Visit: Payer: PRIVATE HEALTH INSURANCE | Admitting: Obstetrics & Gynecology

## 2014-03-16 ENCOUNTER — Ambulatory Visit (INDEPENDENT_AMBULATORY_CARE_PROVIDER_SITE_OTHER): Payer: PRIVATE HEALTH INSURANCE | Admitting: Obstetrics & Gynecology

## 2014-03-16 ENCOUNTER — Encounter: Payer: Self-pay | Admitting: Obstetrics & Gynecology

## 2014-03-16 VITALS — BP 115/78 | HR 81 | Resp 16 | Ht 62.0 in | Wt 219.0 lb

## 2014-03-16 DIAGNOSIS — N949 Unspecified condition associated with female genital organs and menstrual cycle: Secondary | ICD-10-CM

## 2014-03-16 DIAGNOSIS — N925 Other specified irregular menstruation: Secondary | ICD-10-CM

## 2014-03-16 DIAGNOSIS — N938 Other specified abnormal uterine and vaginal bleeding: Secondary | ICD-10-CM

## 2014-03-16 NOTE — Progress Notes (Signed)
   Subjective:    Patient ID: Julia Boyer, female    DOB: [birth year], 38 y.o.   MRN: [id number]  HPI  She is here to discuss her DUB and her desire for endometrial ablation.  Review of Systems H/o tubal ligation    Objective:   Physical Exam        Assessment & Plan:  DUB with negative patholgoy- I will send [place] an email to schedule Novasure ablation.

## 2014-04-11 ENCOUNTER — Encounter (HOSPITAL_COMMUNITY): Payer: Self-pay | Admitting: Pharmacist

## 2014-04-18 ENCOUNTER — Institutional Professional Consult (permissible substitution): Payer: PRIVATE HEALTH INSURANCE | Admitting: Nurse Practitioner

## 2014-04-18 DIAGNOSIS — R51 Headache: Secondary | ICD-10-CM

## 2014-04-24 ENCOUNTER — Ambulatory Visit (HOSPITAL_COMMUNITY)
Admission: RE | Admit: 2014-04-24 | Discharge: 2014-04-24 | Disposition: A | Discharge status: Home / Self Care | Payer: 59 | Source: Ambulatory Visit | Attending: Obstetrics & Gynecology | Admitting: Obstetrics & Gynecology

## 2014-04-24 ENCOUNTER — Ambulatory Visit (HOSPITAL_COMMUNITY): Payer: 59 | Admitting: Anesthesiology

## 2014-04-24 ENCOUNTER — Encounter (HOSPITAL_COMMUNITY): Payer: Self-pay | Admitting: Anesthesiology

## 2014-04-24 ENCOUNTER — Encounter (HOSPITAL_COMMUNITY): Payer: 59 | Admitting: Anesthesiology

## 2014-04-24 ENCOUNTER — Encounter (HOSPITAL_COMMUNITY)
Admission: RE | Disposition: A | Discharge status: Home / Self Care | Payer: Self-pay | Source: Ambulatory Visit | Attending: Obstetrics & Gynecology

## 2014-04-24 DIAGNOSIS — N938 Other specified abnormal uterine and vaginal bleeding: Secondary | ICD-10-CM

## 2014-04-24 DIAGNOSIS — N949 Unspecified condition associated with female genital organs and menstrual cycle: Secondary | ICD-10-CM | POA: Insufficient documentation

## 2014-04-24 DIAGNOSIS — E669 Obesity, unspecified: Secondary | ICD-10-CM | POA: Insufficient documentation

## 2014-04-24 DIAGNOSIS — Z6838 Body mass index (BMI) 38.0-38.9, adult: Secondary | ICD-10-CM | POA: Insufficient documentation

## 2014-04-24 DIAGNOSIS — J45909 Unspecified asthma, uncomplicated: Secondary | ICD-10-CM | POA: Insufficient documentation

## 2014-04-24 DIAGNOSIS — F17200 Nicotine dependence, unspecified, uncomplicated: Secondary | ICD-10-CM | POA: Insufficient documentation

## 2014-04-24 DIAGNOSIS — N925 Other specified irregular menstruation: Secondary | ICD-10-CM | POA: Insufficient documentation

## 2014-04-24 DIAGNOSIS — N854 Malposition of uterus: Secondary | ICD-10-CM | POA: Insufficient documentation

## 2014-04-24 HISTORY — PX: NOVASURE ABLATION: SHX5394

## 2014-04-24 LAB — CBC
HEMATOCRIT: 40.7 % (ref 36.0–46.0)
Hemoglobin: 13.1 g/dL (ref 12.0–15.0)
MCH: 28.3 pg (ref 26.0–34.0)
MCHC: 32.2 g/dL (ref 30.0–36.0)
MCV: 87.9 fL (ref 78.0–100.0)
Platelets: 318 10*3/uL (ref 150–400)
RBC: 4.63 MIL/uL (ref 3.87–5.11)
RDW: 14.5 % (ref 11.5–15.5)
WBC: 8.2 10*3/uL (ref 4.0–10.5)

## 2014-04-24 LAB — PREGNANCY, URINE: PREG TEST UR: NEGATIVE

## 2014-04-24 SURGERY — NOVASURE ABLATION
Anesthesia: Monitor Anesthesia Care | Site: Uterus

## 2014-04-24 MED ORDER — FENTANYL CITRATE 0.05 MG/ML IJ SOLN
INTRAMUSCULAR | Status: AC
  Filled 2014-04-24: qty 2

## 2014-04-24 MED ORDER — LIDOCAINE HCL (CARDIAC) 20 MG/ML IV SOLN
INTRAVENOUS | Status: DC | PRN
  Administered 2014-04-24: 30 mg via INTRAVENOUS

## 2014-04-24 MED ORDER — PROPOFOL 10 MG/ML IV BOLUS
INTRAVENOUS | Status: DC | PRN
  Administered 2014-04-24 (×9): 20 mg via INTRAVENOUS
  Administered 2014-04-24: 60 mg via INTRAVENOUS
  Administered 2014-04-24: 20 mg via INTRAVENOUS

## 2014-04-24 MED ORDER — MIDAZOLAM HCL 2 MG/2ML IJ SOLN
INTRAMUSCULAR | Status: AC
  Filled 2014-04-24: qty 2

## 2014-04-24 MED ORDER — ONDANSETRON HCL 4 MG/2ML IJ SOLN
INTRAMUSCULAR | Status: AC
  Filled 2014-04-24: qty 2

## 2014-04-24 MED ORDER — BUPIVACAINE HCL (PF) 0.5 % IJ SOLN
INTRAMUSCULAR | Status: DC | PRN
  Administered 2014-04-24: 30 mL

## 2014-04-24 MED ORDER — METOCLOPRAMIDE HCL 5 MG/ML IJ SOLN: 10.0000 mg | Freq: Once | INTRAMUSCULAR | Status: DC | PRN

## 2014-04-24 MED ORDER — DEXAMETHASONE SODIUM PHOSPHATE 10 MG/ML IJ SOLN
INTRAMUSCULAR | Status: AC
  Filled 2014-04-24: qty 1

## 2014-04-24 MED ORDER — KETOROLAC TROMETHAMINE 30 MG/ML IJ SOLN
15.0000 mg | Freq: Once | INTRAMUSCULAR | Status: AC | PRN
  Administered 2014-04-24: 30 mg via INTRAVENOUS

## 2014-04-24 MED ORDER — KETOROLAC TROMETHAMINE 30 MG/ML IJ SOLN
INTRAMUSCULAR | Status: AC
  Filled 2014-04-24: qty 1

## 2014-04-24 MED ORDER — OXYCODONE-ACETAMINOPHEN 5-325 MG PO TABS: 1.0000 | ORAL_TABLET | Freq: Four times a day (QID) | ORAL | Status: DC | PRN

## 2014-04-24 MED ORDER — FENTANYL CITRATE 0.05 MG/ML IJ SOLN: 25.0000 ug | INTRAMUSCULAR | Status: DC | PRN

## 2014-04-24 MED ORDER — BUPIVACAINE HCL (PF) 0.5 % IJ SOLN
INTRAMUSCULAR | Status: AC
  Filled 2014-04-24: qty 30

## 2014-04-24 MED ORDER — ONDANSETRON HCL 4 MG/2ML IJ SOLN
INTRAMUSCULAR | Status: DC | PRN
  Administered 2014-04-24: 4 mg via INTRAVENOUS

## 2014-04-24 MED ORDER — PROPOFOL 10 MG/ML IV EMUL
INTRAVENOUS | Status: AC
  Filled 2014-04-24: qty 20

## 2014-04-24 MED ORDER — MIDAZOLAM HCL 2 MG/2ML IJ SOLN
INTRAMUSCULAR | Status: DC | PRN
  Administered 2014-04-24: 2 mg via INTRAVENOUS

## 2014-04-24 MED ORDER — LACTATED RINGERS IV SOLN
INTRAVENOUS | Status: DC
  Administered 2014-04-24: 08:00:00 via INTRAVENOUS

## 2014-04-24 MED ORDER — MEPERIDINE HCL 25 MG/ML IJ SOLN: 6.2500 mg | INTRAMUSCULAR | Status: DC | PRN

## 2014-04-24 MED ORDER — LIDOCAINE HCL (CARDIAC) 20 MG/ML IV SOLN
INTRAVENOUS | Status: AC
  Filled 2014-04-24: qty 5

## 2014-04-24 MED ORDER — FENTANYL CITRATE 0.05 MG/ML IJ SOLN
INTRAMUSCULAR | Status: DC | PRN
  Administered 2014-04-24 (×2): 50 ug via INTRAVENOUS

## 2014-04-24 SURGICAL SUPPLY — 12 items
ABLATOR ENDOMETRIAL BIPOLAR (ABLATOR) ×2 IMPLANT
CATH ROBINSON RED A/P 16FR (CATHETERS) IMPLANT
CLOTH BEACON ORANGE TIMEOUT ST (SAFETY) ×2 IMPLANT
GLOVE BIO SURGEON STRL SZ 6.5 (GLOVE) ×2 IMPLANT
GOWN STRL REUS W/TWL LRG LVL3 (GOWN DISPOSABLE) ×4 IMPLANT
NDL SPNL 22GX3.5 QUINCKE BK (NEEDLE) ×1 IMPLANT
NEEDLE SPNL 22GX3.5 QUINCKE BK (NEEDLE) ×2 IMPLANT
PACK VAGINAL MINOR WOMEN LF (CUSTOM PROCEDURE TRAY) ×2 IMPLANT
SYR 30ML LL (SYRINGE) ×1 IMPLANT
SYR CONTROL 10ML LL (SYRINGE) ×1 IMPLANT
TOWEL OR 17X24 6PK STRL BLUE (TOWEL DISPOSABLE) ×4 IMPLANT
WATER STERILE IRR 1000ML POUR (IV SOLUTION) ×2 IMPLANT

## 2014-04-24 NOTE — Anesthesia Preprocedure Evaluation (Signed)
Anesthesia Evaluation  Patient identified by MRN, date of birth, ID band Patient awake    Reviewed: Allergy & Precautions, H&P , NPO status , Patient's Chart, lab work & pertinent test results, reviewed documented beta blocker date and time   History of Anesthesia Complications Negative for: history of anesthetic complications  Airway Mallampati: III TM Distance: >3 FB Neck ROM: full    Dental  (+) Teeth Intact   Pulmonary asthma (uses albuterol every four hours, also on advair daily) , Current Smoker (1 ppd),  breath sounds clear to auscultation  Pulmonary exam normal - wheezing      Cardiovascular Exercise Tolerance: Good negative cardio ROS  Rhythm:regular Rate:Normal     Neuro/Psych negative neurological ROS  negative psych ROS   GI/Hepatic negative GI ROS, Neg liver ROS,   Endo/Other  BMI 38.5 - obese  Renal/GU negative Renal ROS  Female GU complaint     Musculoskeletal   Abdominal   Peds  Hematology negative hematology ROS (+)   Anesthesia Other Findings   Reproductive/Obstetrics negative OB ROS                           Anesthesia Physical Anesthesia Plan  ASA: II  Anesthesia Plan: General LMA and MAC   Post-op Pain Management:    Induction:   Airway Management Planned:   Additional Equipment:   Intra-op Plan:   Post-operative Plan:   Informed Consent: I have reviewed the patients History and Physical, chart, labs and discussed the procedure including the risks, benefits and alternatives for the proposed anesthesia with the patient or authorized representative who has indicated his/her understanding and acceptance.   Dental Advisory Given  Plan Discussed with: CRNA and Surgeon  Anesthesia Plan Comments:         Anesthesia Quick Evaluation

## 2014-04-24 NOTE — H&P (Signed)
38 yo with Mirena placed 7/14 is here today because of continued heavy irregular bleeding. She had the Mirena placed because of DUB. She has had a BTL in the past. She would like to have the Mirena removed and have an endometrial ablation. We had discussed this option at the time of her Mirena placement.   Pertinent Gynecological History: Menses: flow is excessive with use of 8 pads or tampons on heaviest days Bleeding: dysfunctional uterine bleeding Contraception: none DES exposure: denies Blood transfusions: none Sexually transmitted diseases: no past history Previous GYN Procedures: EMBX 2015 normal  Last mammogram: n/a Last pap: normal Date: 2014    Menstrual History:  No LMP recorded.    Past Medical History  Diagnosis Date  . Asthma   . Obesity     Past Surgical History  Procedure Laterality Date  . Cholecystectomy    . Knee surgery      Rt knee  arthroscopy  . Btl      Family History  Problem Relation Age of Onset  . Diabetes Maternal Grandmother   . Heart disease Father   . Pulmonary embolism Mother     Factor V    Social History:  reports that she has been smoking Cigarettes.  She has a 20 pack-year smoking history. She has never used smokeless tobacco. She reports that she does not drink alcohol or use illicit drugs.  Allergies:  Allergies  Allergen Reactions  . Codeine Nausea And Vomiting    Not sure if she can take oxycodone or hydrocodone  . Doxycycline Nausea And Vomiting    No prescriptions prior to admission    ROS  There were no vitals taken for this visit. Physical Exam Heart- rrr Lungs- CTAB Abd- benign  No results found for this or any previous visit (from the past 24 hour(s)).  No results found.  Assessment/Plan: DUB- plan for endometrial ablation. I have quoted her a 90% satisfaction rate and 40% amenorrhea rate.  She understands the risks of surgery, including, but not to infection, bleeding, DVTs, damage to bowel, bladder,  ureters. She wishes to proceed.     Emily Filbert 04/24/2014, 7:17 AM

## 2014-04-24 NOTE — Anesthesia Postprocedure Evaluation (Signed)
  Anesthesia Post-op Note  Anesthesia Post Note  Patient: Julia Boyer  Procedure(s) Performed: Procedure(s) (LRB): NOVASURE ABLATION (N/A)  Anesthesia type: MAC  Patient location: PACU  Post pain: Pain level controlled  Post assessment: Post-op Vital signs reviewed  Last Vitals:  Filed Vitals:   04/24/14 1044  BP: 101/66  Pulse: 72  Temp: 36.6 C  Resp: 16    Post vital signs: Reviewed  Level of consciousness: sedated  Complications: No apparent anesthesia complications

## 2014-04-24 NOTE — Transfer of Care (Signed)
Immediate Anesthesia Transfer of Care Note  Patient: Julia Boyer  Procedure(s) Performed: Procedure(s): NOVASURE ABLATION (N/A)  Patient Location: PACU  Anesthesia Type:MAC  Level of Consciousness: awake, alert , oriented and patient cooperative  Airway & Oxygen Therapy: Patient Spontanous Breathing  Post-op Assessment: Report given to PACU RN and Post -op Vital signs reviewed and stable  Post vital signs: Reviewed and stable  Complications: No apparent anesthesia complications

## 2014-04-24 NOTE — Op Note (Signed)
04/24/2014  9:20 AM  PATIENT:  Julia Boyer  38 y.o. female  PRE-OPERATIVE DIAGNOSIS:  DISFUNCTINAL UTERINE BLEEDING  POST-OPERATIVE DIAGNOSIS:  DISFUNCTINAL UTERINE BLEEDING  PROCEDURE:  Procedure(s): NOVASURE ABLATION (N/A)  SURGEON:  Surgeon(s) and Role:    * Emily Filbert, MD - Primary  PHYSICIAN ASSISTANT:   ASSISTANTS: none   ANESTHESIA:   IV sedation  EBL:  Total I/O In: 500 [I.V.:500] Out: -   BLOOD ADMINISTERED:none  DRAINS: none   LOCAL MEDICATIONS USED:  MARCAINE     SPECIMEN:  No Specimen  DISPOSITION OF SPECIMEN:  N/A  COUNTS:  YES  TOURNIQUET:  * No tourniquets in log *  DICTATION: .Dragon Dictation  PLAN OF CARE: Discharge to home after PACU  PATIENT DISPOSITION:  PACU - hemodynamically stable.   Delay start of Pharmacological VTE agent (>24hrs) due to surgical blood loss or risk of bleeding: not applicable    The risks, benefits, and alternatives of surgery were explained, understood, and accepted. I quoted her a 90% satisfaction rate for the NovaSure endometrial ablation. All questions were answered. She was taken to the operating room and placed in the dorsal lithotomy position. MAC was applied without complication. Her vagina was prepped and draped in the usual sterile fashion.  A bimanual exam revealed a normal size and shaped anteverted uterus.  Her adnexa were non-enlarged. A Graves speculum was placed posteriorly and the anterior lip of her cervix was grasped with a single-tooth tenaculum. A paracervical block was performed using 30 mL of 0.5% Marcaine. Her uterus sounded to 8 cm. Her cervical length measured 4 cm. This gives her uterine cavity length of 4 cm. The cervix was gently dilated with Hegar dilators to accommodate the NovaSure device. The arms of the device was deployed and the uterine cavity width measured 4.5 cm. The device passed its test. An it ran for 120 seconds. I removed the tenaculum and no bleeding was noted. The NovaSure  device was removed and no bleeding was noted from the endocervix. She was extubated and taken to the recovery room in stable condition. She tolerated the procedure well.

## 2014-04-24 NOTE — Discharge Instructions (Signed)
DISCHARGE INSTRUCTIONS: HYSTEROSCOPY / ENDOMETRIAL ABLATION The following instructions have been prepared to help you care for yourself upon your return home.  Personal hygiene:  Use sanitary pads for vaginal drainage, not tampons.  Shower the day after your procedure.  NO tub baths, pools or Jacuzzis for 2-3 weeks.  Wipe front to back after using the bathroom.  Activity and limitations:  Do NOT drive or operate any equipment for 24 hours. The effects of anesthesia are still present and drowsiness may result.  Do NOT rest in bed all day.  Walking is encouraged.  Walk up and down stairs slowly.  You may resume your normal activity in one to two days or as indicated by your physician. Sexual activity: NO intercourse for at least 2 weeks after the procedure, or as indicated by your Doctor.  Diet: Eat a light meal as desired this evening. You may resume your usual diet tomorrow.  Return to Work: You may resume your work activities in one to two days or as indicated by Marine scientist.  What to expect after your surgery: Expect to have vaginal bleeding/discharge for 2-3 days and spotting for up to 10 days. It is not unusual to have soreness for up to 1-2 weeks. You may have a slight burning sensation when you urinate for the first day. Mild cramps may continue for a couple of days. You may have a regular period in 2-6 weeks.  Call your doctor for any of the following:  Excessive vaginal bleeding or clotting, saturating and changing one pad every hour.  Inability to urinate 6 hours after discharge from hospital.  Pain not relieved by pain medication.  Fever of 100.4 F or greater.  Unusual vaginal discharge or odor.  [facility] [phone]

## 2014-04-25 ENCOUNTER — Encounter (HOSPITAL_COMMUNITY): Payer: Self-pay | Admitting: Obstetrics & Gynecology

## 2014-10-16 ENCOUNTER — Encounter (HOSPITAL_COMMUNITY): Payer: Self-pay | Admitting: Obstetrics & Gynecology

## 2017-07-28 ENCOUNTER — Ambulatory Visit (INDEPENDENT_AMBULATORY_CARE_PROVIDER_SITE_OTHER): Payer: BLUE CROSS/BLUE SHIELD | Admitting: Obstetrics & Gynecology

## 2017-07-28 ENCOUNTER — Encounter: Payer: Self-pay | Admitting: Obstetrics & Gynecology

## 2017-07-28 VITALS — BP 133/81 | HR 92 | Wt 228.2 lb

## 2017-07-28 DIAGNOSIS — Z1151 Encounter for screening for human papillomavirus (HPV): Secondary | ICD-10-CM

## 2017-07-28 DIAGNOSIS — Z124 Encounter for screening for malignant neoplasm of cervix: Secondary | ICD-10-CM | POA: Diagnosis not present

## 2017-07-28 DIAGNOSIS — Z01419 Encounter for gynecological examination (general) (routine) without abnormal findings: Secondary | ICD-10-CM

## 2017-07-28 DIAGNOSIS — Z1231 Encounter for screening mammogram for malignant neoplasm of breast: Secondary | ICD-10-CM

## 2017-07-28 DIAGNOSIS — N938 Other specified abnormal uterine and vaginal bleeding: Secondary | ICD-10-CM

## 2017-07-28 NOTE — Progress Notes (Signed)
Subjective:    Julia Boyer is a 41 y.o. MW P2 (39 and 66 yo kids, no grands) female who presents for an annual exam. She had a Novasure in 2015 and had no bleeding until about 5 months ago when she started spotting and cramping. Spotting is monthly. The patient is sexually active, monthly. GYN screening history: last pap: was normal. The patient wears seatbelts: yes. The patient participates in regular exercise: yes. Has the patient ever been transfused or tattooed?: yes. The patient reports that there is not domestic violence in her life.   Menstrual History: OB History    Gravida Para Term Preterm AB Living   2 2 2     2    SAB TAB Ectopic Multiple Live Births                  Menarche age: 57 No LMP recorded. Patient has had an ablation.    The following portions of the patient's history were reviewed and updated as appropriate: allergies, current medications, past family history, past medical history, past social history, past surgical history and problem list.  Review of Systems Pertinent items are noted in HPI.   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, + uterine in mom, no colon cancer Fasting blood work at [employer]   Objective:    BP 133/81   Pulse 92   Wt 228 lb 3.2 oz (103.5 kg)   BMI 41.74 kg/m   General Appearance:    Alert, cooperative, no distress, appears stated age  Head:    Normocephalic, without obvious abnormality, atraumatic  Eyes:    PERRL, conjunctiva/corneas clear, EOM's intact, fundi    benign, both eyes  Ears:    Normal TM's and external ear canals, both ears  Nose:   Nares normal, septum midline, mucosa normal, no drainage    or sinus tenderness  Throat:   Lips, mucosa, and tongue normal; teeth and gums normal  Neck:   Supple, symmetrical, trachea midline, no adenopathy;    thyroid:  no enlargement/tenderness/nodules; no carotid   bruit or JVD  Back:     Symmetric, no  curvature, ROM normal, no CVA tenderness  Lungs:     Clear to auscultation bilaterally, respirations unlabored  Chest Wall:    No tenderness or deformity   Heart:    Regular rate and rhythm, S1 and S2 normal, no murmur, rub   or gallop  Breast Exam:    No tenderness, masses, or nipple abnormality  Abdomen:     Soft, non-tender, bowel sounds active all four quadrants,    no masses, no organomegaly  Genitalia:    Normal female without lesion, discharge or tenderness, normal appearance of cervix, no palpable masses with bimanual exam     Extremities:   Extremities normal, atraumatic, no cyanosis or edema  Pulses:   2+ and symmetric all extremities  Skin:   Skin color, texture, turgor normal, no rashes or lesions  Lymph nodes:   Cervical, supraclavicular, and axillary nodes normal  Neurologic:   CNII-XII intact, normal strength, sensation and reflexes    throughout  .    Assessment:    Healthy female exam.   DUB   Plan:     Thin prep Pap smear.  with cotesting Mammogram Gyn u/s RTC 4 weeks

## 2017-07-31 LAB — CYTOLOGY - PAP
Diagnosis: NEGATIVE
HPV: NOT DETECTED

## 2017-08-05 ENCOUNTER — Ambulatory Visit (HOSPITAL_COMMUNITY)
Admission: RE | Admit: 2017-08-05 | Discharge: 2017-08-05 | Disposition: A | Discharge status: Home / Self Care | Payer: BLUE CROSS/BLUE SHIELD | Source: Ambulatory Visit | Attending: Obstetrics & Gynecology | Admitting: Obstetrics & Gynecology

## 2017-08-05 DIAGNOSIS — D251 Intramural leiomyoma of uterus: Secondary | ICD-10-CM | POA: Diagnosis not present

## 2017-08-05 DIAGNOSIS — N859 Noninflammatory disorder of uterus, unspecified: Secondary | ICD-10-CM | POA: Diagnosis not present

## 2017-08-05 DIAGNOSIS — N938 Other specified abnormal uterine and vaginal bleeding: Secondary | ICD-10-CM | POA: Diagnosis present

## 2017-08-14 ENCOUNTER — Ambulatory Visit
Admission: RE | Admit: 2017-08-14 | Discharge: 2017-08-14 | Disposition: A | Discharge status: Home / Self Care | Payer: BLUE CROSS/BLUE SHIELD | Source: Ambulatory Visit | Attending: Obstetrics & Gynecology | Admitting: Obstetrics & Gynecology

## 2017-08-14 DIAGNOSIS — Z1231 Encounter for screening mammogram for malignant neoplasm of breast: Secondary | ICD-10-CM

## 2017-08-18 ENCOUNTER — Ambulatory Visit (INDEPENDENT_AMBULATORY_CARE_PROVIDER_SITE_OTHER): Payer: BLUE CROSS/BLUE SHIELD | Admitting: Obstetrics & Gynecology

## 2017-08-18 ENCOUNTER — Encounter: Payer: Self-pay | Admitting: Obstetrics & Gynecology

## 2017-08-18 VITALS — BP 123/72 | HR 82 | Resp 16 | Ht 62.0 in | Wt 228.0 lb

## 2017-08-18 DIAGNOSIS — N938 Other specified abnormal uterine and vaginal bleeding: Secondary | ICD-10-CM

## 2017-08-18 NOTE — Progress Notes (Signed)
   Subjective:    Patient ID: Julia Boyer, female    DOB: 09-28-1976, 41 y.o.   MRN: [id number]  HPI   41 yo MW P2 here for follow up after an u/s done last week for evalution of the return of DUB.   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  5 mm hyperechoic lesion within the endometrium wedge may have a stalk. This is concerning for endometrial polyp. Consider further evaluation with sonohysterogram for confirmation prior to hysteroscopy. Endometrial sampling should also be considered if patient is at high risk for endometrial carcinoma. (Ref: Radiological Reasoning: Algorithmic Workup of Abnormal Vaginal Bleeding with Endovaginal Sonography and Sonohysterography. AJR 2008; 158:X09-40)   Review of Systems Review of Systems   She has had a BTL Objective:   Physical Exam Well nourished, well hydrated white female, no apparent distress Breathing, conversing, and ambulating normally        Assessment & Plan:  DUB/periods, new since her ablation in 2015- now with probable polyp Offered/rec'd h/s, removal of polyp/d&c/possible repeat ablation She would like to schedule this

## 2017-08-25 ENCOUNTER — Encounter (HOSPITAL_COMMUNITY): Payer: Self-pay

## 2017-09-17 ENCOUNTER — Encounter (HOSPITAL_BASED_OUTPATIENT_CLINIC_OR_DEPARTMENT_OTHER): Payer: Self-pay | Admitting: *Deleted

## 2017-09-17 NOTE — Progress Notes (Signed)
Pt instructed npo p mn10/8 to include no candy, mint gum, cigarettes.  To [facility] 10/9 @ 0530.  Pt to get cbc, urine hcg on Mon. 10/8.pt informed that if she gets her albuterol inhaler she should use it and bring it with her. She will use her advair am of surgery.

## 2017-09-21 DIAGNOSIS — Z888 Allergy status to other drugs, medicaments and biological substances status: Secondary | ICD-10-CM | POA: Diagnosis not present

## 2017-09-21 DIAGNOSIS — E669 Obesity, unspecified: Secondary | ICD-10-CM | POA: Diagnosis not present

## 2017-09-21 DIAGNOSIS — N938 Other specified abnormal uterine and vaginal bleeding: Secondary | ICD-10-CM | POA: Diagnosis present

## 2017-09-21 DIAGNOSIS — J45909 Unspecified asthma, uncomplicated: Secondary | ICD-10-CM | POA: Diagnosis not present

## 2017-09-21 DIAGNOSIS — Z791 Long term (current) use of non-steroidal anti-inflammatories (NSAID): Secondary | ICD-10-CM | POA: Diagnosis not present

## 2017-09-21 DIAGNOSIS — Z79899 Other long term (current) drug therapy: Secondary | ICD-10-CM | POA: Diagnosis not present

## 2017-09-21 DIAGNOSIS — F17210 Nicotine dependence, cigarettes, uncomplicated: Secondary | ICD-10-CM | POA: Diagnosis not present

## 2017-09-21 LAB — CBC
HCT: 43.9 % (ref 36.0–46.0)
Hemoglobin: 14.5 g/dL (ref 12.0–15.0)
MCH: 31.6 pg (ref 26.0–34.0)
MCHC: 33 g/dL (ref 30.0–36.0)
MCV: 95.6 fL (ref 78.0–100.0)
PLATELETS: 277 10*3/uL (ref 150–400)
RBC: 4.59 MIL/uL (ref 3.87–5.11)
RDW: 12.6 % (ref 11.5–15.5)
WBC: 11.6 10*3/uL — ABNORMAL HIGH (ref 4.0–10.5)

## 2017-09-21 NOTE — Anesthesia Preprocedure Evaluation (Addendum)
Anesthesia Evaluation  Patient identified by MRN, date of birth, ID band Patient awake    Reviewed: Allergy & Precautions, NPO status , Patient's Chart, lab work & pertinent test results  History of Anesthesia Complications Negative for: history of anesthetic complications  Airway Mallampati: II  TM Distance: >3 FB Neck ROM: Full    Dental no notable dental hx. (+) Dental Advisory Given   Pulmonary asthma , Current Smoker,    Pulmonary exam normal        Cardiovascular negative cardio ROS Normal cardiovascular exam     Neuro/Psych negative neurological ROS  negative psych ROS   GI/Hepatic negative GI ROS, Neg liver ROS,   Endo/Other  Morbid obesity  Renal/GU negative Renal ROS     Musculoskeletal negative musculoskeletal ROS (+)   Abdominal   Peds  Hematology negative hematology ROS (+)   Anesthesia Other Findings Day of surgery medications reviewed with the patient.  Reproductive/Obstetrics                           Anesthesia Physical Anesthesia Plan  ASA: III  Anesthesia Plan: General   Post-op Pain Management:    Induction: Intravenous  PONV Risk Score and Plan: 3 and Ondansetron, Dexamethasone and Scopolamine patch - Pre-op  Airway Management Planned: LMA  Additional Equipment:   Intra-op Plan:   Post-operative Plan: Extubation in OR  Informed Consent: I have reviewed the patients History and Physical, chart, labs and discussed the procedure including the risks, benefits and alternatives for the proposed anesthesia with the patient or authorized representative who has indicated his/her understanding and acceptance.   Dental advisory given  Plan Discussed with: CRNA, Anesthesiologist and Surgeon  Anesthesia Plan Comments:       Anesthesia Quick Evaluation

## 2017-09-22 ENCOUNTER — Ambulatory Visit (HOSPITAL_BASED_OUTPATIENT_CLINIC_OR_DEPARTMENT_OTHER): Payer: BLUE CROSS/BLUE SHIELD | Admitting: Anesthesiology

## 2017-09-22 ENCOUNTER — Ambulatory Visit (HOSPITAL_BASED_OUTPATIENT_CLINIC_OR_DEPARTMENT_OTHER)
Admission: RE | Admit: 2017-09-22 | Discharge: 2017-09-22 | Disposition: A | Discharge status: Home / Self Care | Payer: BLUE CROSS/BLUE SHIELD | Source: Ambulatory Visit | Attending: Obstetrics & Gynecology | Admitting: Obstetrics & Gynecology

## 2017-09-22 ENCOUNTER — Encounter (HOSPITAL_BASED_OUTPATIENT_CLINIC_OR_DEPARTMENT_OTHER): Payer: Self-pay | Admitting: *Deleted

## 2017-09-22 ENCOUNTER — Encounter (HOSPITAL_BASED_OUTPATIENT_CLINIC_OR_DEPARTMENT_OTHER)
Admission: RE | Disposition: A | Discharge status: Home / Self Care | Payer: Self-pay | Source: Ambulatory Visit | Attending: Obstetrics & Gynecology

## 2017-09-22 DIAGNOSIS — F17210 Nicotine dependence, cigarettes, uncomplicated: Secondary | ICD-10-CM | POA: Insufficient documentation

## 2017-09-22 DIAGNOSIS — Z791 Long term (current) use of non-steroidal anti-inflammatories (NSAID): Secondary | ICD-10-CM | POA: Insufficient documentation

## 2017-09-22 DIAGNOSIS — Z79899 Other long term (current) drug therapy: Secondary | ICD-10-CM | POA: Insufficient documentation

## 2017-09-22 DIAGNOSIS — E669 Obesity, unspecified: Secondary | ICD-10-CM | POA: Insufficient documentation

## 2017-09-22 DIAGNOSIS — N938 Other specified abnormal uterine and vaginal bleeding: Secondary | ICD-10-CM | POA: Insufficient documentation

## 2017-09-22 DIAGNOSIS — Z888 Allergy status to other drugs, medicaments and biological substances status: Secondary | ICD-10-CM | POA: Insufficient documentation

## 2017-09-22 DIAGNOSIS — J45909 Unspecified asthma, uncomplicated: Secondary | ICD-10-CM | POA: Insufficient documentation

## 2017-09-22 HISTORY — DX: Headache: R51

## 2017-09-22 HISTORY — DX: Headache, unspecified: R51.9

## 2017-09-22 HISTORY — DX: Dyspnea, unspecified: R06.00

## 2017-09-22 HISTORY — DX: Dysphonia: R49.0

## 2017-09-22 HISTORY — PX: DILITATION & CURRETTAGE/HYSTROSCOPY WITH HYDROTHERMAL ABLATION: SHX5570

## 2017-09-22 HISTORY — DX: Adverse effect of unspecified anesthetic, initial encounter: T41.45XA

## 2017-09-22 HISTORY — DX: Other complications of anesthesia, initial encounter: T88.59XA

## 2017-09-22 HISTORY — DX: Presence of spectacles and contact lenses: Z97.3

## 2017-09-22 LAB — POCT PREGNANCY, URINE: PREG TEST UR: NEGATIVE

## 2017-09-22 SURGERY — DILATATION & CURETTAGE/HYSTEROSCOPY WITH HYDROTHERMAL ABLATION
Anesthesia: General

## 2017-09-22 MED ORDER — KETOROLAC TROMETHAMINE 30 MG/ML IJ SOLN
INTRAMUSCULAR | Status: DC | PRN
  Administered 2017-09-22: 30 mg via INTRAVENOUS

## 2017-09-22 MED ORDER — ONDANSETRON HCL 4 MG/2ML IJ SOLN
INTRAMUSCULAR | Status: AC
  Filled 2017-09-22: qty 2

## 2017-09-22 MED ORDER — DEXAMETHASONE SODIUM PHOSPHATE 10 MG/ML IJ SOLN
INTRAMUSCULAR | Status: DC | PRN
  Administered 2017-09-22: 10 mg via INTRAVENOUS

## 2017-09-22 MED ORDER — IBUPROFEN 600 MG PO TABS: 600.0000 mg | ORAL_TABLET | Freq: Four times a day (QID) | ORAL | 1 refills | Status: DC | PRN

## 2017-09-22 MED ORDER — ALBUTEROL SULFATE HFA 108 (90 BASE) MCG/ACT IN AERS
INHALATION_SPRAY | RESPIRATORY_TRACT | Status: AC
  Filled 2017-09-22: qty 6.7

## 2017-09-22 MED ORDER — HYDROMORPHONE HCL-NACL 0.5-0.9 MG/ML-% IV SOSY
PREFILLED_SYRINGE | INTRAVENOUS | Status: AC
  Filled 2017-09-22: qty 1

## 2017-09-22 MED ORDER — PROPOFOL 10 MG/ML IV BOLUS
INTRAVENOUS | Status: DC | PRN
  Administered 2017-09-22: 300 mg via INTRAVENOUS

## 2017-09-22 MED ORDER — HYDROMORPHONE HCL 1 MG/ML IJ SOLN
0.2500 mg | INTRAMUSCULAR | Status: DC | PRN
  Administered 2017-09-22: 0.25 mg via INTRAVENOUS
  Filled 2017-09-22: qty 0.5

## 2017-09-22 MED ORDER — LACTATED RINGERS IV SOLN
INTRAVENOUS | Status: DC
  Administered 2017-09-22: 06:00:00 via INTRAVENOUS
  Filled 2017-09-22: qty 1000

## 2017-09-22 MED ORDER — FENTANYL CITRATE (PF) 100 MCG/2ML IJ SOLN
INTRAMUSCULAR | Status: AC
  Filled 2017-09-22: qty 2

## 2017-09-22 MED ORDER — SCOPOLAMINE 1 MG/3DAYS TD PT72
MEDICATED_PATCH | TRANSDERMAL | Status: AC
  Filled 2017-09-22: qty 1

## 2017-09-22 MED ORDER — LIDOCAINE 2% (20 MG/ML) 5 ML SYRINGE
INTRAMUSCULAR | Status: AC
  Filled 2017-09-22: qty 5

## 2017-09-22 MED ORDER — KETOROLAC TROMETHAMINE 30 MG/ML IJ SOLN
INTRAMUSCULAR | Status: AC
  Filled 2017-09-22: qty 1

## 2017-09-22 MED ORDER — PROMETHAZINE HCL 25 MG/ML IJ SOLN
6.2500 mg | INTRAMUSCULAR | Status: DC | PRN
  Filled 2017-09-22: qty 1

## 2017-09-22 MED ORDER — LIDOCAINE 2% (20 MG/ML) 5 ML SYRINGE
INTRAMUSCULAR | Status: DC | PRN
  Administered 2017-09-22: 100 mg via INTRAVENOUS

## 2017-09-22 MED ORDER — BUPIVACAINE HCL (PF) 0.5 % IJ SOLN
INTRAMUSCULAR | Status: DC | PRN
  Administered 2017-09-22: 30 mL

## 2017-09-22 MED ORDER — DEXAMETHASONE SODIUM PHOSPHATE 10 MG/ML IJ SOLN
INTRAMUSCULAR | Status: AC
  Filled 2017-09-22: qty 1

## 2017-09-22 MED ORDER — MIDAZOLAM HCL 2 MG/2ML IJ SOLN
INTRAMUSCULAR | Status: AC
  Filled 2017-09-22: qty 2

## 2017-09-22 MED ORDER — PROPOFOL 10 MG/ML IV BOLUS
INTRAVENOUS | Status: AC
  Filled 2017-09-22: qty 40

## 2017-09-22 MED ORDER — SCOPOLAMINE 1 MG/3DAYS TD PT72
1.0000 | MEDICATED_PATCH | TRANSDERMAL | Status: DC
  Administered 2017-09-22: 1.5 mg via TRANSDERMAL
  Filled 2017-09-22: qty 1

## 2017-09-22 MED ORDER — MIDAZOLAM HCL 5 MG/5ML IJ SOLN
INTRAMUSCULAR | Status: DC | PRN
  Administered 2017-09-22: 2 mg via INTRAVENOUS

## 2017-09-22 MED ORDER — ONDANSETRON HCL 4 MG/2ML IJ SOLN
INTRAMUSCULAR | Status: DC | PRN
  Administered 2017-09-22: 4 mg via INTRAVENOUS

## 2017-09-22 MED ORDER — FENTANYL CITRATE (PF) 100 MCG/2ML IJ SOLN
INTRAMUSCULAR | Status: DC | PRN
  Administered 2017-09-22 (×2): 50 ug via INTRAVENOUS

## 2017-09-22 SURGICAL SUPPLY — 19 items
CANISTER SUCT 3000ML PPV (MISCELLANEOUS) ×2 IMPLANT
CLOTH BEACON ORANGE TIMEOUT ST (SAFETY) ×2 IMPLANT
DILATOR CANAL MILEX (MISCELLANEOUS) IMPLANT
ELECT REM PT RETURN 9FT ADLT (ELECTROSURGICAL)
ELECTRODE REM PT RTRN 9FT ADLT (ELECTROSURGICAL) IMPLANT
GLOVE BIO SURGEON STRL SZ 6.5 (GLOVE) ×2 IMPLANT
GLOVE BIOGEL PI IND STRL 7.0 (GLOVE) ×1 IMPLANT
GLOVE BIOGEL PI INDICATOR 7.0 (GLOVE) ×1
GOWN STRL REUS W/TWL LRG LVL3 (GOWN DISPOSABLE) ×4 IMPLANT
NDL SPNL 18GX3.5 QUINCKE PK (NEEDLE) ×1 IMPLANT
NEEDLE SPNL 18GX3.5 QUINCKE PK (NEEDLE) ×2 IMPLANT
PACK VAGINAL MINOR WOMEN LF (CUSTOM PROCEDURE TRAY) ×2 IMPLANT
PAD OB MATERNITY 4.3X12.25 (PERSONAL CARE ITEMS) ×2 IMPLANT
PAD PREP 24X48 CUFFED NSTRL (MISCELLANEOUS) ×2 IMPLANT
SET GENESYS HTA PROCERVA (MISCELLANEOUS) ×1 IMPLANT
SYR 30ML LL (SYRINGE) ×2 IMPLANT
TOWEL OR 17X24 6PK STRL BLUE (TOWEL DISPOSABLE) ×4 IMPLANT
TUBING AQUILEX INFLOW (TUBING) IMPLANT
TUBING AQUILEX OUTFLOW (TUBING) IMPLANT

## 2017-09-22 NOTE — Addendum Note (Signed)
Addendum  created 09/22/17 0045 by Duane Boston, MD   Sign clinical note

## 2017-09-22 NOTE — Discharge Instructions (Signed)
Dilation and Curettage or Vacuum Curettage, Care After This sheet gives you information about how to care for yourself after your procedure. Your health care provider may also give you more specific instructions. If you have problems or questions, contact your health care provider. What can I expect after the procedure? After your procedure, it is common to have:  Mild pain or cramping.  Some vaginal bleeding or spotting.    These may last for up to 2 weeks after your procedure. Follow these instructions at home: Activity   Do not drive or use heavy machinery while taking prescription pain medicine.  Avoid driving for the first 24 hours after your procedure.  Take frequent, short walks, followed by rest periods, throughout the day. Ask your health care provider what activities are safe for you. After 1-2 days, you may be able to return to your normal activities.  Do not lift anything heavier than 10 lb (4.5 kg) until your health care provider approves.  For at least 2 weeks, or as long as told by your health care provider, do not: ? Douche. ? Use tampons. ? Have sexual intercourse. General instructions   Take over-the-counter and prescription medicines only as told by your health care provider. This is especially important if you take blood thinning medicine.  Do not take baths, swim, or use a hot tub until your health care provider approves. Take showers instead of baths.  Wear compression stockings as told by your health care provider. These stockings help to prevent blood clots and reduce swelling in your legs.  It is your responsibility to get the results of your procedure. Ask your health care provider, or the department performing the procedure, when your results will be ready.  Keep all follow-up visits as told by your health care provider. This is important. Contact a health care provider if:  You have severe cramps that get worse or that do not get better with  medicine.  You have severe abdominal pain.  You cannot drink fluids without vomiting.  You develop pain in a different area of your pelvis.  You have bad-smelling vaginal discharge.  You have a rash. Get help right away if:  You have vaginal bleeding that soaks more than one sanitary pad in 1 hour, for 2 hours in a row.  You pass large blood clots from your vagina.  You have a fever that is above 100.19F (38.0C).  Your abdomen feels very tender or hard.  You have chest pain.  You have shortness of breath.  You cough up blood.  You feel dizzy or light-headed.  You faint.  You have pain in your neck or shoulder area. This information is not intended to replace advice given to you by your health care provider. Make sure you discuss any questions you have with your health care provider. Document Released: 11/28/2000 Document Revised: 07/30/2016 Document Reviewed: 07/03/2016 Elsevier Interactive Patient Education  2018 [place] [facility] Home Care Instructions  Activity: Get plenty of rest for the remainder of the day. A responsible individual must stay with you for 24 hours following the procedure.  For the next 24 hours, DO NOT: -Drive a car -Paediatric nurse -Drink alcoholic beverages -Take any medication unless instructed by your physician -Make any legal decisions or sign important papers.  Meals: Start with liquid foods such as gelatin or soup. Progress to regular foods as tolerated. Avoid greasy, spicy, heavy foods. If nausea and/or vomiting occur, drink only clear liquids until  the nausea and/or vomiting subsides. Call your physician if vomiting continues.  Special Instructions/Symptoms: Your throat may feel dry or sore from the anesthesia or the breathing tube placed in your throat during surgery. If this causes discomfort, gargle with warm salt water. The discomfort should disappear within 24 hours.  If you had a scopolamine patch placed behind  your ear for the management of post- operative nausea and/or vomiting:  1. The medication in the patch is effective for 72 hours, after which it should be removed.  Wrap patch in a tissue and discard in the trash. Wash hands thoroughly with soap and water. 2. You may remove the patch earlier than 72 hours if you experience unpleasant side effects which may include dry mouth, dizziness or visual disturbances. 3. Avoid touching the patch. Wash your hands with soap and water after contact with the patch.     [facility] Home care Instructions:   Personal hygiene:  Used sanitary napkins for vaginal drainage not tampons. Shower or tub bathe the day after your procedure. No douching until bleeding stops. Always wipe from front to back after  Elimination.  Activity: Do not drive or operate any equipment today. The effects of the anesthesia are still present and drowsiness may result. Rest today, not necessarily flat bed rest, just take it easy. You may resume your normal activity in one to 2 days.  Sexual activity: No intercourse for one week or as indicated by your physician  Diet: Eat a light diet as desired this evening. You may resume a regular diet tomorrow.  Return to work: One to 2 days.  General Expectations of your surgery: Vaginal bleeding should be no heavier than a normal period. Spotting may continue up to 10 days. Mild cramps may continue for a couple of days. You may have a regular period in 2-6 weeks.  Unexpected observations call your doctor if these occur: persistent or heavy bleeding. Severe abdominal cramping or pain. Elevation of temperature greater than 100F.  Call for an appointment in one week.    Patient's Signature_______________________________________________________  Nurse's Signature________________________________________________________

## 2017-09-22 NOTE — Anesthesia Postprocedure Evaluation (Signed)
Anesthesia Post Note  Patient: Julia Boyer  Procedure(s) Performed: DILATATION & CURETTAGE/HYSTEROSCOPY WITH HYDROTHERMAL ABLATION (N/A )     Patient location during evaluation: PACU Anesthesia Type: General Level of consciousness: sedated Pain management: pain level controlled Vital Signs Assessment: post-procedure vital signs reviewed and stable Respiratory status: spontaneous breathing and respiratory function stable Cardiovascular status: stable Postop Assessment: no apparent nausea or vomiting Anesthetic complications: no    Last Vitals:  Vitals:   09/22/17 0900 09/22/17 0933  BP: 115/69 (!) 113/97  Pulse: 75 80  Resp: 12 18  Temp:  36.8 C  SpO2: 92% 96%    Last Pain:  Vitals:   09/22/17 0933  TempSrc: Oral  PainSc:                  Alvita Fana DANIEL

## 2017-09-22 NOTE — Op Note (Signed)
09/22/2017  9:19 AM  PATIENT:  Julia Boyer  41 y.o. female  PRE-OPERATIVE DIAGNOSIS:  DUB  POST-OPERATIVE DIAGNOSIS:  DUB  PROCEDURE:  HYSTEROSCOPY, DILATION AND CURETTAGE  SURGEON:  Surgeon(s) and Role:    * Julio Storr C, MD - Primary  ANESTHESIA:   local and general  EBL:  Total I/O In: 650 [I.V.:650] Out: 10 [Blood:10]  BLOOD ADMINISTERED:none  DRAINS: none   LOCAL MEDICATIONS USED:  MARCAINE     SPECIMEN:  Source of Specimen:  Uterine curettings  DISPOSITION OF SPECIMEN:  PATHOLOGY  COUNTS:  YES  TOURNIQUET:  * No tourniquets in log *  DICTATION: .Dragon Dictation  PLAN OF CARE: Discharge to home after PACU  PATIENT DISPOSITION:  PACU - hemodynamically stable.   Delay start of Pharmacological VTE agent (>24hrs) due to surgical blood loss or risk of bleeding: not applicable    The risks, benefits, and alternatives of surgery were explained, understood, and accepted. All questions were answered. Consents were signed. In the operating room general anesthesia was applied without complication, and she was placed in the dorsal lithotomy position. Her vagina was prepped and draped in the usual sterile fashion.  A bimanual exam revealed a NSS anteverted mobile uterus. Her adnexa were nonenlarged. A speculum was placed and a single-tooth tenaculum was used to grasp the anterior lip of her cervix. A total of 30 mL of 0.5% Marcaine was used to perform a paracervical block. Her uterus sounded to 9 cm. Her cervix was carefully and slowly dilated to accommodate a hysteroscope.Hysteroscopy showed a shaggy endometrium but no polyp or fibroid was visualized. There was quite a bit of scarring from her Novasure. I was not able to visualize the tubal ostia.  A curettage was done in all quadrants and the fundus of the uterus. A small amount tissue was obtained. A gritty sensation was appreciated throughout. I placed the HTA device but it did not pass its test, suggested that there  might be a perforation. There was no bleeding noted at the end of the case. She was taken to the recovery room after being extubated. She tolerated the procedure well.

## 2017-09-22 NOTE — Anesthesia Procedure Notes (Signed)
Procedure Name: LMA Insertion Date/Time: 09/22/2017 7:29 AM Performed by: Bethena Roys T Pre-anesthesia Checklist: Patient identified, Emergency Drugs available, Suction available and Patient being monitored Patient Re-evaluated:Patient Re-evaluated prior to induction Oxygen Delivery Method: Circle system utilized Preoxygenation: Pre-oxygenation with 100% oxygen Induction Type: IV induction Ventilation: Mask ventilation without difficulty LMA: LMA inserted LMA Size: 4.0 Number of attempts: 1 Airway Equipment and Method: Bite block Placement Confirmation: positive ETCO2 Tube secured with: Tape Dental Injury: Teeth and Oropharynx as per pre-operative assessment

## 2017-09-22 NOTE — H&P (Signed)
Julia Boyer is an 41 y.o. She had a Novasure in 2015 and had no bleeding until about 5 months ago when she started spotting and cramping. A recent u/s showed what looks like a small fibroid versus polyp. She would like another ablation along with the removal of the polyp/fibroid.   No LMP recorded. Patient has had an ablation.    Past Medical History:  Diagnosis Date  . Asthma   . Complication of anesthesia    wakes up w asthma attack. last surgery prednisone preop helped  . Dyspnea    w/exertion  . Headache   . Obesity   . Raspy voice   . Wears glasses     Past Surgical History:  Procedure Laterality Date  . BTL    . CHOLECYSTECTOMY    . KNEE SURGERY     Rt knee  arthroscopy  . NOVASURE ABLATION N/A 04/24/2014   Procedure: NOVASURE ABLATION;  Surgeon: Emily Filbert, MD;  Location: [facility];  Service: Gynecology;  Laterality: N/A;    Family History  Problem Relation Age of Onset  . Diabetes Maternal Grandmother   . Heart disease Father   . Pulmonary embolism Mother        Factor V  . Breast cancer Maternal Aunt     Social History:  reports that she has been smoking Cigarettes.  She has a 20.00 pack-year smoking history. She has never used smokeless tobacco. She reports that she drinks about 0.6 oz of alcohol per week . She reports that she does not use drugs.  Allergies:  Allergies  Allergen Reactions  . Codeine Nausea And Vomiting    Not sure if she can take oxycodone or hydrocodone  . Doxycycline Nausea And Vomiting    Prescriptions Prior to Admission  Medication Sig Dispense Refill Last Dose  . Fluticasone-Salmeterol (ADVAIR) 250-50 MCG/DOSE AEPB Inhale 1 puff into the lungs every 12 (twelve) hours.   09/22/2017 at 0400  . ibuprofen (ADVIL,MOTRIN) 200 MG tablet Take 600 mg by mouth every 6 (six) hours as needed for headache.   09/21/2017 at Unknown time  . albuterol (PROVENTIL HFA;VENTOLIN HFA) 108 (90 BASE) MCG/ACT inhaler Inhale 2 puffs into the lungs every 6  (six) hours as needed for wheezing or shortness of breath.    More than a month at Unknown time  . Topiramate (TROKENDI XR PO) Take 75 mg by mouth daily.   More than a month at Unknown time    ROS  Blood pressure 112/74, pulse 82, temperature 98.5 F (36.9 C), temperature source Oral, resp. rate 18, height 5' 2"  (1.575 m), weight 105.2 kg (232 lb), SpO2 96 %. Physical Exam  Heart- rrr Lungs- CTAB Abd- benign  Results for orders placed or performed during the hospital encounter of 09/22/17 (from the past 24 hour(s))  CBC     Status: Abnormal   Collection Time: 09/21/17 12:20 PM  Result Value Ref Range   WBC 11.6 (H) 4.0 - 10.5 K/uL   RBC 4.59 3.87 - 5.11 MIL/uL   Hemoglobin 14.5 12.0 - 15.0 g/dL   HCT 43.9 36.0 - 46.0 %   MCV 95.6 78.0 - 100.0 fL   MCH 31.6 26.0 - 34.0 pg   MCHC 33.0 30.0 - 36.0 g/dL   RDW 12.6 11.5 - 15.5 %   Platelets 277 150 - 400 K/uL    No results found.  Assessment/Plan: DUB with a polyp/fibroid-plan for hysteroscopy, dilation and curettage, resection of fibroid/polyp/HTA ablation.  She  understands the risks of surgery, including, but not to infection, bleeding, DVTs, damage to bowel, bladder, ureters. She wishes to proceed. I quoted her a 90% satisfaction rate and a 40% amenorrhea rate.     Julia Boyer 09/22/2017, 7:05 AM

## 2017-09-22 NOTE — Transfer of Care (Signed)
Immediate Anesthesia Transfer of Care Note  Patient: Julia Boyer  Procedure(s) Performed: DILATATION & CURETTAGE/HYSTEROSCOPY WITH HYDROTHERMAL ABLATION (N/A )  Patient Location: PACU  Anesthesia Type:General  Level of Consciousness: awake, alert  and oriented  Airway & Oxygen Therapy: Patient Spontanous Breathing and Patient connected to nasal cannula oxygen  Post-op Assessment: Report given to RN  Post vital signs: Reviewed and stable  Last Vitals: 118/81, 87, 15, 96%, 97.7 Vitals:   09/22/17 0545 09/22/17 0821  BP: 112/74   Pulse: 82   Resp: 18   Temp: 36.9 C (P) 36.5 C  SpO2: 96%     Last Pain:  Vitals:   09/22/17 0613  TempSrc:   PainSc: 3       Patients Stated Pain Goal: 7 (69/45/03 8882)  Complications: No apparent anesthesia complications

## 2017-09-23 ENCOUNTER — Encounter (HOSPITAL_BASED_OUTPATIENT_CLINIC_OR_DEPARTMENT_OTHER): Payer: Self-pay | Admitting: Obstetrics & Gynecology

## 2017-10-07 ENCOUNTER — Telehealth: Payer: Self-pay

## 2017-10-07 NOTE — Telephone Encounter (Signed)
SENT NOTES TO SCHEDULING 

## 2017-10-23 ENCOUNTER — Encounter: Payer: Self-pay | Admitting: Cardiovascular Disease

## 2017-11-03 ENCOUNTER — Ambulatory Visit: Payer: BLUE CROSS/BLUE SHIELD | Admitting: Cardiovascular Disease

## 2017-12-28 ENCOUNTER — Ambulatory Visit (HOSPITAL_COMMUNITY)
Admission: RE | Admit: 2017-12-28 | Discharge: 2017-12-28 | Disposition: A | Discharge status: Home / Self Care | Payer: BLUE CROSS/BLUE SHIELD | Source: Ambulatory Visit | Attending: Urology | Admitting: Urology

## 2017-12-28 ENCOUNTER — Other Ambulatory Visit (HOSPITAL_COMMUNITY): Payer: Self-pay | Admitting: Urology

## 2017-12-28 ENCOUNTER — Other Ambulatory Visit: Payer: Self-pay | Admitting: Urology

## 2017-12-28 DIAGNOSIS — D4101 Neoplasm of uncertain behavior of right kidney: Secondary | ICD-10-CM | POA: Diagnosis not present

## 2018-01-22 NOTE — Patient Instructions (Addendum)
TASHEIKA KITZMILLER  01/22/2018   Your procedure is scheduled on: Friday 01/29/2018  Report to [facility] Main  Entrance              Report to admitting at  1000 AM    Call this number if you have problems the morning of surgery [phone]               Follow a clear liquid diet and any bowel prep instructions day before surgery on 01/28/2018!    CLEAR LIQUID DIET   Foods Allowed                                                                     Foods Excluded  Coffee and tea, regular and decaf                             liquids that you cannot  Plain Jell-O in any flavor                                             see through such as: Fruit ices (not with fruit pulp)                                     milk, soups, orange juice  Iced Popsicles                                    All solid food Carbonated beverages, regular and diet                                    Cranberry, grape and apple juices Sports drinks like Gatorade Lightly seasoned clear broth or consume(fat free) Sugar, honey syrup  Sample Menu Breakfast                                Lunch                                     Supper Cranberry juice                    Beef broth                            Chicken broth Jell-O                                     Grape juice  Apple juice Coffee or tea                        Jell-O                                      Popsicle                                                Coffee or tea                        Coffee or tea  _____________________________________________________________________    Remember: Do not eat food or drink liquids :After Midnight.     Take these medicines the morning of surgery with A SIP OF WATER: use Albuterol inhaler if needed and Advair inhaler and bring inhalers with you to the hospital                                You may not have any metal on your body including hair pins and               piercings  Do not wear jewelry, make-up, lotions, powders or perfumes, deodorant             Do not wear nail polish.  Do not shave  48 hours prior to surgery.              Men may shave face and neck.   Do not bring valuables to the hospital. [place].  Contacts, dentures or bridgework may not be worn into surgery.  Leave suitcase in the car. After surgery it may be brought to your room.                 Please read over the following fact sheets you were given: _____________________________________________________________________             [facility] - Preparing for Surgery Before surgery, you can play an important role.  Because skin is not sterile, your skin needs to be as free of germs as possible.  You can reduce the number of germs on your skin by washing with CHG (chlorahexidine gluconate) soap before surgery.  CHG is an antiseptic cleaner which kills germs and bonds with the skin to continue killing germs even after washing. Please DO NOT use if you have an allergy to CHG or antibacterial soaps.  If your skin becomes reddened/irritated stop using the CHG and inform your nurse when you arrive at Short Stay. Do not shave (including legs and underarms) for at least 48 hours prior to the first CHG shower.  You may shave your face/neck. Please follow these instructions carefully:  1.  Shower with CHG Soap the night before surgery and the  morning of Surgery.  2.  If you choose to wash your hair, wash your hair first as usual with your  normal  shampoo.  3.  After you shampoo, rinse your hair and body thoroughly to remove the  shampoo.  4.  Use CHG as you would any other liquid soap.  You can apply chg directly  to the skin and wash                       Gently with a scrungie or clean washcloth.  5.  Apply the CHG Soap to your body ONLY FROM THE NECK DOWN.   Do not use on face/ open                            Wound or open sores. Avoid contact with eyes, ears mouth and genitals (private parts).                       Wash face,  Genitals (private parts) with your normal soap.             6.  Wash thoroughly, paying special attention to the area where your surgery  will be performed.  7.  Thoroughly rinse your body with warm water from the neck down.  8.  DO NOT shower/wash with your normal soap after using and rinsing off  the CHG Soap.                9.  Pat yourself dry with a clean towel.            10.  Wear clean pajamas.            11.  Place clean sheets on your bed the night of your first shower and do not  sleep with pets. Day of Surgery : Do not apply any lotions/deodorants the morning of surgery.  Please wear clean clothes to the hospital/surgery center.  FAILURE TO FOLLOW THESE INSTRUCTIONS MAY RESULT IN THE CANCELLATION OF YOUR SURGERY PATIENT SIGNATURE_________________________________  NURSE SIGNATURE__________________________________  ________________________________________________________________________

## 2018-01-25 ENCOUNTER — Encounter (HOSPITAL_COMMUNITY): Payer: Self-pay

## 2018-01-25 ENCOUNTER — Encounter (HOSPITAL_COMMUNITY)
Admission: RE | Admit: 2018-01-25 | Discharge: 2018-01-25 | Disposition: A | Discharge status: Home / Self Care | Payer: BLUE CROSS/BLUE SHIELD | Source: Ambulatory Visit | Attending: Urology | Admitting: Urology

## 2018-01-25 ENCOUNTER — Other Ambulatory Visit: Payer: Self-pay

## 2018-01-25 DIAGNOSIS — Z01812 Encounter for preprocedural laboratory examination: Secondary | ICD-10-CM | POA: Diagnosis not present

## 2018-01-25 DIAGNOSIS — N2889 Other specified disorders of kidney and ureter: Secondary | ICD-10-CM | POA: Insufficient documentation

## 2018-01-25 LAB — CBC
HEMATOCRIT: 43.8 % (ref 36.0–46.0)
Hemoglobin: 14.3 g/dL (ref 12.0–15.0)
MCH: 31.6 pg (ref 26.0–34.0)
MCHC: 32.6 g/dL (ref 30.0–36.0)
MCV: 96.7 fL (ref 78.0–100.0)
Platelets: 274 10*3/uL (ref 150–400)
RBC: 4.53 MIL/uL (ref 3.87–5.11)
RDW: 12.6 % (ref 11.5–15.5)
WBC: 8 10*3/uL (ref 4.0–10.5)

## 2018-01-25 LAB — BASIC METABOLIC PANEL
Anion gap: 10 (ref 5–15)
BUN: 12 mg/dL (ref 6–20)
CO2: 24 mmol/L (ref 22–32)
Calcium: 9.2 mg/dL (ref 8.9–10.3)
Chloride: 105 mmol/L (ref 101–111)
Creatinine, Ser: 0.75 mg/dL (ref 0.44–1.00)
GFR calc Af Amer: >60 mL/min (ref >60)
GLUCOSE: 119 mg/dL — AB (ref 65–99)
POTASSIUM: 3.9 mmol/L (ref 3.5–5.1)
SODIUM: 139 mmol/L (ref 135–145)

## 2018-01-25 LAB — HCG, SERUM, QUALITATIVE: Preg, Serum: NEGATIVE

## 2018-01-26 LAB — ABO/RH: ABO/RH(D): A POS

## 2018-01-28 NOTE — H&P (Signed)
Urology Preoperative H&P   Chief Complaint: Right renal mass  History of Present Illness: Julia Boyer is a 42 y.o. female referred for a heterogenous 5 cm right renal mass seen on recent CT which was initially ordered to evaluate left sided-flank/abdominal pain. Long smoking history--states that she started smoking 1 ppd in her teens. She denies right flank pain, gross hematuria or any urinary filling/voiding issues. No personal/family history of GU malignancies.   CT abd/pel without contrast (12/19/2017)- Round 5 cm slightly heterogenous right renal mass. No renal calculi. No hydronephrosis  CT abd/pel without and with contrast (12/25/17)- confirmed heterogenous enhancing 5 x 4 cm right renal mass, concerning for malignancy    Past Medical History:  Diagnosis Date  . Asthma   . Complication of anesthesia    wakes up w asthma attack. last surgery prednisone preop helped  . Dyspnea    w/exertion  . Headache   . Obesity   . Raspy voice   . Wears glasses     Past Surgical History:  Procedure Laterality Date  . BTL    . CHOLECYSTECTOMY    . DILATION AND CURETTAGE OF UTERUS    . DILITATION & CURRETTAGE/HYSTROSCOPY WITH HYDROTHERMAL ABLATION N/A 09/22/2017   Procedure: DILATATION & CURETTAGE/HYSTEROSCOPY WITH HYDROTHERMAL ABLATION;  Surgeon: Emily Filbert, MD;  Location: [facility];  Service: Gynecology;  Laterality: N/A;  . KNEE SURGERY     Rt knee  arthroscopy  . NOVASURE ABLATION N/A 04/24/2014   Procedure: NOVASURE ABLATION;  Surgeon: Emily Filbert, MD;  Location: [facility];  Service: Gynecology;  Laterality: N/A;    Allergies:  Allergies  Allergen Reactions  . Codeine Nausea And Vomiting    Not sure if she can take oxycodone or hydrocodone  . Doxycycline Nausea And Vomiting    Family History  Problem Relation Age of Onset  . Diabetes Maternal Grandmother   . Heart disease Father   . Pulmonary embolism Mother        Factor V  . Breast cancer Maternal Aunt      Social History:  reports that she quit smoking about 2 weeks ago. Her smoking use included cigarettes. She has a 20.00 pack-year smoking history. she has never used smokeless tobacco. She reports that she drinks about 0.6 oz of alcohol per week. She reports that she does not use drugs.  ROS: A complete review of systems was performed.  All systems are negative except for pertinent findings as noted.  Physical Exam:  Vital signs in last 24 hours:   Constitutional:  Alert and oriented, No acute distress Cardiovascular: Regular rate and rhythm, No JVD Respiratory: Normal respiratory effort, Lungs clear bilaterally GI: Abdomen is soft, nontender, nondistended, no abdominal masses GU: No CVA tenderness Lymphatic: No lymphadenopathy Neurologic: Grossly intact, no focal deficits Psychiatric: Normal mood and affect  Laboratory Data:  Recent Labs    01/25/18 1500  WBC 8.0  HGB 14.3  HCT 43.8  PLT 274    Recent Labs    01/25/18 1500  NA 139  K 3.9  CL 105  GLUCOSE 119*  BUN 12  CALCIUM 9.2  CREATININE 0.75     No results found for this or any previous visit (from the past 24 hour(s)). No results found for this or any previous visit (from the past 240 hour(s)).  Renal Function: Recent Labs    01/25/18 1500  CREATININE 0.75   Estimated Creatinine Clearance: 106.6 mL/min (by C-G formula based on  SCr of 0.75 mg/dL).  Radiologic Imaging: No results found.  I independently reviewed the above imaging studies.  Assessment and Plan Julia Boyer is a 42 y.o. female with a 5 x 4 cm right renal mass features concerning for malignancy  --Reviewed imaging results and films with the patient personally. Discussed that the mass in question has features concerning for malignancy. Explained the natural history of presumed renal cell carcinoma. Reviewed the AUA guidelines for evaluation and treatment of the small renal mass. Active surveillance, in situ tumor ablation,  partial and radical nephrectomy discussed. The risks of robotic partial nephrectomy were discussed in detail including but not limited to: negative pathology, open conversion, completion nephrectomy, infection of the urinary tract/skin/abdominal cavity, VTE, MI/CVA, lymphatic leak, injury to adjacent solid/hollow viscus organs, bleeding requiring a blood transfusion, catastrophic bleeding, hernia formation, need for postoperative angioembolization, urinary leak requiring stent/drain, and other imponderables.   -The patient and her husband voice understanding and wished to proceed with robotic-assisted RIGHT partial nephrectomy.  Ellison Hughs, MD 01/28/2018, 11:28 AM  [facility] Pager: [phone]

## 2018-01-29 ENCOUNTER — Ambulatory Visit (HOSPITAL_COMMUNITY): Payer: BLUE CROSS/BLUE SHIELD | Admitting: Certified Registered Nurse Anesthetist

## 2018-01-29 ENCOUNTER — Inpatient Hospital Stay (HOSPITAL_COMMUNITY)
Admission: AD | Admit: 2018-01-29 | Discharge: 2018-01-31 | DRG: 660 | Disposition: A | Discharge status: Home / Self Care | Payer: BLUE CROSS/BLUE SHIELD | Source: Ambulatory Visit | Attending: Urology | Admitting: Urology

## 2018-01-29 ENCOUNTER — Encounter (HOSPITAL_COMMUNITY): Payer: Self-pay

## 2018-01-29 ENCOUNTER — Encounter (HOSPITAL_COMMUNITY)
Admission: AD | Disposition: A | Discharge status: Home / Self Care | Payer: Self-pay | Source: Ambulatory Visit | Attending: Urology

## 2018-01-29 DIAGNOSIS — Z881 Allergy status to other antibiotic agents status: Secondary | ICD-10-CM | POA: Diagnosis not present

## 2018-01-29 DIAGNOSIS — J45909 Unspecified asthma, uncomplicated: Secondary | ICD-10-CM | POA: Diagnosis present

## 2018-01-29 DIAGNOSIS — R51 Headache: Secondary | ICD-10-CM | POA: Diagnosis present

## 2018-01-29 DIAGNOSIS — Z6841 Body Mass Index (BMI) 40.0 and over, adult: Secondary | ICD-10-CM

## 2018-01-29 DIAGNOSIS — Z79899 Other long term (current) drug therapy: Secondary | ICD-10-CM | POA: Diagnosis not present

## 2018-01-29 DIAGNOSIS — N2889 Other specified disorders of kidney and ureter: Secondary | ICD-10-CM | POA: Diagnosis present

## 2018-01-29 DIAGNOSIS — Z885 Allergy status to narcotic agent status: Secondary | ICD-10-CM | POA: Diagnosis not present

## 2018-01-29 DIAGNOSIS — Z7951 Long term (current) use of inhaled steroids: Secondary | ICD-10-CM | POA: Diagnosis not present

## 2018-01-29 DIAGNOSIS — Z23 Encounter for immunization: Secondary | ICD-10-CM

## 2018-01-29 DIAGNOSIS — Z87891 Personal history of nicotine dependence: Secondary | ICD-10-CM | POA: Diagnosis not present

## 2018-01-29 DIAGNOSIS — Z9189 Other specified personal risk factors, not elsewhere classified: Secondary | ICD-10-CM | POA: Diagnosis not present

## 2018-01-29 HISTORY — PX: ROBOTIC ASSITED PARTIAL NEPHRECTOMY: SHX6087

## 2018-01-29 LAB — TYPE AND SCREEN
ABO/RH(D): A POS
Antibody Screen: NEGATIVE

## 2018-01-29 LAB — HEMOGLOBIN AND HEMATOCRIT, BLOOD
HEMATOCRIT: 42.7 % (ref 36.0–46.0)
Hemoglobin: 14.4 g/dL (ref 12.0–15.0)

## 2018-01-29 SURGERY — ROBOTIC ASSITED PARTIAL NEPHRECTOMY
Anesthesia: General | Site: Abdomen | Laterality: Right

## 2018-01-29 MED ORDER — PROPOFOL 10 MG/ML IV BOLUS
INTRAVENOUS | Status: AC
  Filled 2018-01-29: qty 20

## 2018-01-29 MED ORDER — HYDROCODONE-ACETAMINOPHEN 5-325 MG PO TABS: 1.0000 | ORAL_TABLET | Freq: Four times a day (QID) | ORAL | 0 refills | Status: AC | PRN

## 2018-01-29 MED ORDER — HEPARIN SODIUM (PORCINE) 5000 UNIT/ML IJ SOLN
5000.0000 [IU] | Freq: Three times a day (TID) | INTRAMUSCULAR | Status: DC
  Administered 2018-01-30 – 2018-01-31 (×4): 5000 [IU] via SUBCUTANEOUS
  Filled 2018-01-29 (×4): qty 1

## 2018-01-29 MED ORDER — FENTANYL CITRATE (PF) 100 MCG/2ML IJ SOLN
INTRAMUSCULAR | Status: AC
  Administered 2018-01-29: 50 ug via INTRAVENOUS
  Filled 2018-01-29: qty 2

## 2018-01-29 MED ORDER — HYDROMORPHONE HCL 1 MG/ML IJ SOLN
0.5000 mg | INTRAMUSCULAR | Status: DC | PRN
  Administered 2018-01-30 – 2018-01-31 (×5): 1 mg via INTRAVENOUS
  Filled 2018-01-29 (×5): qty 1

## 2018-01-29 MED ORDER — ALBUTEROL SULFATE (2.5 MG/3ML) 0.083% IN NEBU
3.0000 mL | INHALATION_SOLUTION | Freq: Four times a day (QID) | RESPIRATORY_TRACT | Status: DC | PRN
  Administered 2018-01-30: 3 mL via RESPIRATORY_TRACT

## 2018-01-29 MED ORDER — DIPHENHYDRAMINE HCL 50 MG/ML IJ SOLN: 12.5000 mg | Freq: Four times a day (QID) | INTRAMUSCULAR | Status: DC | PRN

## 2018-01-29 MED ORDER — LIDOCAINE 2% (20 MG/ML) 5 ML SYRINGE
INTRAMUSCULAR | Status: AC
  Filled 2018-01-29: qty 5

## 2018-01-29 MED ORDER — ONDANSETRON HCL 4 MG/2ML IJ SOLN
4.0000 mg | Freq: Once | INTRAMUSCULAR | Status: DC | PRN
Start: 2018-01-29 — End: 2018-01-29

## 2018-01-29 MED ORDER — STERILE WATER FOR IRRIGATION IR SOLN
Status: DC | PRN
  Administered 2018-01-29: 1000 mL

## 2018-01-29 MED ORDER — NICOTINE 21 MG/24HR TD PT24
21.0000 mg | MEDICATED_PATCH | Freq: Every day | TRANSDERMAL | Status: DC
  Administered 2018-01-30: 21 mg via TRANSDERMAL
  Filled 2018-01-29 (×2): qty 1

## 2018-01-29 MED ORDER — ALBUTEROL SULFATE HFA 108 (90 BASE) MCG/ACT IN AERS
INHALATION_SPRAY | RESPIRATORY_TRACT | Status: AC
  Filled 2018-01-29: qty 6.7

## 2018-01-29 MED ORDER — FENTANYL CITRATE (PF) 250 MCG/5ML IJ SOLN
INTRAMUSCULAR | Status: AC
  Filled 2018-01-29: qty 5

## 2018-01-29 MED ORDER — ONDANSETRON HCL 4 MG/2ML IJ SOLN
4.0000 mg | INTRAMUSCULAR | Status: DC | PRN
  Administered 2018-01-29 – 2018-01-31 (×8): 4 mg via INTRAVENOUS
  Filled 2018-01-29 (×8): qty 2

## 2018-01-29 MED ORDER — OXYCODONE HCL 5 MG/5ML PO SOLN
5.0000 mg | Freq: Once | ORAL | Status: DC | PRN
  Filled 2018-01-29: qty 5

## 2018-01-29 MED ORDER — LIDOCAINE 2% (20 MG/ML) 5 ML SYRINGE
INTRAMUSCULAR | Status: DC | PRN
  Administered 2018-01-29: 80 mg via INTRAVENOUS

## 2018-01-29 MED ORDER — LACTATED RINGERS IR SOLN
Status: DC | PRN
  Administered 2018-01-29: 1000 mL

## 2018-01-29 MED ORDER — OXYCODONE HCL 5 MG PO TABS
5.0000 mg | ORAL_TABLET | ORAL | Status: DC | PRN
  Administered 2018-01-29 – 2018-01-31 (×6): 5 mg via ORAL
  Filled 2018-01-29 (×7): qty 1

## 2018-01-29 MED ORDER — SUCCINYLCHOLINE CHLORIDE 200 MG/10ML IV SOSY
PREFILLED_SYRINGE | INTRAVENOUS | Status: AC
  Filled 2018-01-29: qty 10

## 2018-01-29 MED ORDER — DEXAMETHASONE SODIUM PHOSPHATE 10 MG/ML IJ SOLN
INTRAMUSCULAR | Status: DC | PRN
  Administered 2018-01-29: 10 mg via INTRAVENOUS

## 2018-01-29 MED ORDER — CEFAZOLIN SODIUM-DEXTROSE 2-4 GM/100ML-% IV SOLN
2.0000 g | Freq: Once | INTRAVENOUS | Status: AC
  Administered 2018-01-29: 2 g via INTRAVENOUS
  Filled 2018-01-29: qty 100

## 2018-01-29 MED ORDER — ROCURONIUM BROMIDE 10 MG/ML (PF) SYRINGE
PREFILLED_SYRINGE | INTRAVENOUS | Status: AC
  Filled 2018-01-29: qty 5

## 2018-01-29 MED ORDER — ONDANSETRON HCL 4 MG/2ML IJ SOLN
INTRAMUSCULAR | Status: AC
  Filled 2018-01-29: qty 2

## 2018-01-29 MED ORDER — KETOROLAC TROMETHAMINE 30 MG/ML IJ SOLN
30.0000 mg | Freq: Once | INTRAMUSCULAR | Status: DC | PRN
  Administered 2018-01-29: 30 mg via INTRAVENOUS

## 2018-01-29 MED ORDER — SCOPOLAMINE 1 MG/3DAYS TD PT72
MEDICATED_PATCH | TRANSDERMAL | Status: AC
  Filled 2018-01-29: qty 1

## 2018-01-29 MED ORDER — ONDANSETRON HCL 4 MG/2ML IJ SOLN
INTRAMUSCULAR | Status: DC | PRN
  Administered 2018-01-29: 4 mg via INTRAVENOUS

## 2018-01-29 MED ORDER — MEPERIDINE HCL 50 MG/ML IJ SOLN: 6.2500 mg | INTRAMUSCULAR | Status: DC | PRN

## 2018-01-29 MED ORDER — DEXTROSE-NACL 5-0.45 % IV SOLN
INTRAVENOUS | Status: DC
  Administered 2018-01-29 – 2018-01-30 (×2): via INTRAVENOUS

## 2018-01-29 MED ORDER — ALBUTEROL SULFATE (2.5 MG/3ML) 0.083% IN NEBU
2.5000 mg | INHALATION_SOLUTION | RESPIRATORY_TRACT | Status: AC
  Administered 2018-01-29: 2.5 mg via RESPIRATORY_TRACT
  Filled 2018-01-29: qty 3

## 2018-01-29 MED ORDER — FENTANYL CITRATE (PF) 100 MCG/2ML IJ SOLN
INTRAMUSCULAR | Status: DC | PRN
  Administered 2018-01-29 (×5): 50 ug via INTRAVENOUS

## 2018-01-29 MED ORDER — ROCURONIUM BROMIDE 50 MG/5ML IV SOSY
PREFILLED_SYRINGE | INTRAVENOUS | Status: DC | PRN
  Administered 2018-01-29: 20 mg via INTRAVENOUS
  Administered 2018-01-29: 10 mg via INTRAVENOUS
  Administered 2018-01-29: 50 mg via INTRAVENOUS
  Administered 2018-01-29 (×2): 20 mg via INTRAVENOUS

## 2018-01-29 MED ORDER — FENTANYL CITRATE (PF) 100 MCG/2ML IJ SOLN
25.0000 ug | INTRAMUSCULAR | Status: DC | PRN
  Administered 2018-01-29 (×4): 50 ug via INTRAVENOUS

## 2018-01-29 MED ORDER — DIPHENHYDRAMINE HCL 12.5 MG/5ML PO ELIX: 12.5000 mg | ORAL_SOLUTION | Freq: Four times a day (QID) | ORAL | Status: DC | PRN

## 2018-01-29 MED ORDER — CEFAZOLIN SODIUM-DEXTROSE 1-4 GM/50ML-% IV SOLN
1.0000 g | Freq: Three times a day (TID) | INTRAVENOUS | Status: AC
  Administered 2018-01-29 – 2018-01-30 (×3): 1 g via INTRAVENOUS
  Filled 2018-01-29 (×4): qty 50

## 2018-01-29 MED ORDER — KETOROLAC TROMETHAMINE 30 MG/ML IJ SOLN
INTRAMUSCULAR | Status: AC
  Administered 2018-01-29: 30 mg via INTRAVENOUS
  Filled 2018-01-29: qty 1

## 2018-01-29 MED ORDER — PROPOFOL 10 MG/ML IV BOLUS
INTRAVENOUS | Status: DC | PRN
  Administered 2018-01-29: 150 mg via INTRAVENOUS

## 2018-01-29 MED ORDER — OXYCODONE HCL 5 MG PO TABS: 5.0000 mg | ORAL_TABLET | Freq: Once | ORAL | Status: DC | PRN

## 2018-01-29 MED ORDER — ACETAMINOPHEN 325 MG PO TABS: 325.0000 mg | ORAL_TABLET | ORAL | Status: DC | PRN

## 2018-01-29 MED ORDER — MIDAZOLAM HCL 5 MG/5ML IJ SOLN
INTRAMUSCULAR | Status: DC | PRN
  Administered 2018-01-29: 2 mg via INTRAVENOUS

## 2018-01-29 MED ORDER — BUPIVACAINE-EPINEPHRINE 0.25% -1:200000 IJ SOLN
INTRAMUSCULAR | Status: AC
  Filled 2018-01-29: qty 1

## 2018-01-29 MED ORDER — ONDANSETRON HCL 4 MG PO TABS: 4.0000 mg | ORAL_TABLET | Freq: Three times a day (TID) | ORAL | 0 refills | Status: AC | PRN

## 2018-01-29 MED ORDER — SCOPOLAMINE 1 MG/3DAYS TD PT72
MEDICATED_PATCH | TRANSDERMAL | Status: DC | PRN
  Administered 2018-01-29: 1 via TRANSDERMAL

## 2018-01-29 MED ORDER — MOMETASONE FURO-FORMOTEROL FUM 200-5 MCG/ACT IN AERO
2.0000 | INHALATION_SPRAY | Freq: Two times a day (BID) | RESPIRATORY_TRACT | Status: DC
  Administered 2018-01-30 – 2018-01-31 (×2): 2 via RESPIRATORY_TRACT
  Filled 2018-01-29 (×2): qty 8.8

## 2018-01-29 MED ORDER — SUCCINYLCHOLINE CHLORIDE 200 MG/10ML IV SOSY
PREFILLED_SYRINGE | INTRAVENOUS | Status: DC | PRN
  Administered 2018-01-29: 140 mg via INTRAVENOUS

## 2018-01-29 MED ORDER — CHLORHEXIDINE GLUCONATE 4 % EX LIQD: Freq: Once | CUTANEOUS | Status: DC

## 2018-01-29 MED ORDER — SUGAMMADEX SODIUM 200 MG/2ML IV SOLN
INTRAVENOUS | Status: DC | PRN
  Administered 2018-01-29: 200 mg via INTRAVENOUS

## 2018-01-29 MED ORDER — DOCUSATE SODIUM 100 MG PO CAPS: 100.0000 mg | ORAL_CAPSULE | Freq: Two times a day (BID) | ORAL | 0 refills | Status: AC

## 2018-01-29 MED ORDER — BUPIVACAINE-EPINEPHRINE 0.25% -1:200000 IJ SOLN
INTRAMUSCULAR | Status: DC | PRN
  Administered 2018-01-29: 50 mL
  Administered 2018-01-29: 10 mL

## 2018-01-29 MED ORDER — DEXAMETHASONE SODIUM PHOSPHATE 10 MG/ML IJ SOLN
INTRAMUSCULAR | Status: AC
  Filled 2018-01-29: qty 1

## 2018-01-29 MED ORDER — FLUOXETINE HCL 10 MG PO CAPS
10.0000 mg | ORAL_CAPSULE | Freq: Every day | ORAL | Status: DC
  Administered 2018-01-29 – 2018-01-30 (×2): 10 mg via ORAL
  Filled 2018-01-29 (×2): qty 1

## 2018-01-29 MED ORDER — MIDAZOLAM HCL 2 MG/2ML IJ SOLN
INTRAMUSCULAR | Status: AC
  Filled 2018-01-29: qty 2

## 2018-01-29 MED ORDER — LACTATED RINGERS IV SOLN
INTRAVENOUS | Status: DC
  Administered 2018-01-29 (×3): via INTRAVENOUS

## 2018-01-29 MED ORDER — ALBUTEROL SULFATE HFA 108 (90 BASE) MCG/ACT IN AERS
INHALATION_SPRAY | RESPIRATORY_TRACT | Status: DC | PRN
  Administered 2018-01-29: 5 via RESPIRATORY_TRACT

## 2018-01-29 MED ORDER — ACETAMINOPHEN 160 MG/5ML PO SOLN: 325.0000 mg | ORAL | Status: DC | PRN

## 2018-01-29 MED ORDER — ACETAMINOPHEN 10 MG/ML IV SOLN
1000.0000 mg | Freq: Four times a day (QID) | INTRAVENOUS | Status: DC
  Administered 2018-01-29 – 2018-01-30 (×3): 1000 mg via INTRAVENOUS
  Filled 2018-01-29 (×5): qty 100

## 2018-01-29 SURGICAL SUPPLY — 64 items
ADH SKN CLS APL DERMABOND .7 (GAUZE/BANDAGES/DRESSINGS) ×1
APL ESCP 34 STRL LF DISP (HEMOSTASIS) ×1
APPLICATOR SURGIFLO ENDO (HEMOSTASIS) ×2 IMPLANT
BAG LAPAROSCOPIC 12 15 PORT 16 (BASKET) ×1 IMPLANT
BAG RETRIEVAL 12/15 (BASKET)
BAG SPEC RTRVL LRG 6X4 10 (ENDOMECHANICALS) ×1
CHLORAPREP W/TINT 26ML (MISCELLANEOUS) ×3 IMPLANT
CLIP SUT LAPRA TY ABSORB (SUTURE) ×2 IMPLANT
CLIP VESOLOCK LG 6/CT PURPLE (CLIP) ×3 IMPLANT
CLIP VESOLOCK MED LG 6/CT (CLIP) ×8 IMPLANT
COVER SURGICAL LIGHT HANDLE (MISCELLANEOUS) ×2 IMPLANT
COVER TIP SHEARS 8 DVNC (MISCELLANEOUS) ×1 IMPLANT
COVER TIP SHEARS 8MM DA VINCI (MISCELLANEOUS) ×1
CUTTER FLEX LINEAR 45M (STAPLE) IMPLANT
DECANTER SPIKE VIAL GLASS SM (MISCELLANEOUS) ×2 IMPLANT
DERMABOND ADVANCED (GAUZE/BANDAGES/DRESSINGS) ×1
DERMABOND ADVANCED .7 DNX12 (GAUZE/BANDAGES/DRESSINGS) ×2 IMPLANT
DRAIN CHANNEL 15F RND FF 3/16 (WOUND CARE) IMPLANT
DRAPE ARM DVNC X/XI (DISPOSABLE) ×4 IMPLANT
DRAPE COLUMN DVNC XI (DISPOSABLE) ×1 IMPLANT
DRAPE DA VINCI XI ARM (DISPOSABLE) ×4
DRAPE DA VINCI XI COLUMN (DISPOSABLE) ×1
DRAPE INCISE IOBAN 66X45 STRL (DRAPES) ×2 IMPLANT
DRAPE LAPAROSCOPIC ABDOMINAL (DRAPES) ×2 IMPLANT
DRAPE SHEET LG 3/4 BI-LAMINATE (DRAPES) ×2 IMPLANT
DRSG TEGADERM 4X4.75 (GAUZE/BANDAGES/DRESSINGS) ×2 IMPLANT
ELECT PENCIL ROCKER SW 15FT (MISCELLANEOUS) ×2 IMPLANT
ELECT REM PT RETURN 15FT ADLT (MISCELLANEOUS) ×2 IMPLANT
EVACUATOR SILICONE 100CC (DRAIN) ×1 IMPLANT
FLOSEAL 10ML (HEMOSTASIS) ×2 IMPLANT
GLOVE BIO SURGEON STRL SZ 6.5 (GLOVE) ×2 IMPLANT
GLOVE BIOGEL M STRL SZ7.5 (GLOVE) ×4 IMPLANT
GOWN STRL REUS W/TWL LRG LVL3 (GOWN DISPOSABLE) ×6 IMPLANT
IRRIG SUCT STRYKERFLOW 2 WTIP (MISCELLANEOUS) ×2
IRRIGATION SUCT STRKRFLW 2 WTP (MISCELLANEOUS) IMPLANT
KIT BASIN OR (CUSTOM PROCEDURE TRAY) ×2 IMPLANT
NS IRRIG 1000ML POUR BTL (IV SOLUTION) ×1 IMPLANT
POSITIONER SURGICAL ARM (MISCELLANEOUS) ×4 IMPLANT
POUCH SPECIMEN RETRIEVAL 10MM (ENDOMECHANICALS) ×2 IMPLANT
RELOAD 45 VASCULAR/THIN (ENDOMECHANICALS) IMPLANT
RELOAD STAPLE 45 2.5 WHT GRN (ENDOMECHANICALS) IMPLANT
SEAL CANN UNIV 5-8 DVNC XI (MISCELLANEOUS) ×4 IMPLANT
SEAL XI 5MM-8MM UNIVERSAL (MISCELLANEOUS) ×4
SOLUTION ELECTROLUBE (MISCELLANEOUS) ×2 IMPLANT
STAPLER VISISTAT 35W (STAPLE) ×1 IMPLANT
SUT ETHILON 3 0 PS 1 (SUTURE) ×2 IMPLANT
SUT MNCRL AB 4-0 PS2 18 (SUTURE) IMPLANT
SUT PDS AB 0 CT1 36 (SUTURE) ×4 IMPLANT
SUT PDS AB 1 CTX 36 (SUTURE) IMPLANT
SUT V-LOC BARB 180 2/0GR6 GS22 (SUTURE) ×6
SUT VIC AB 1 CT1 27 (SUTURE) ×8
SUT VIC AB 1 CT1 27XBRD ANTBC (SUTURE) IMPLANT
SUT VIC AB 3-0 SH 27 (SUTURE) ×2
SUT VIC AB 3-0 SH 27XBRD (SUTURE) IMPLANT
SUT VICRYL 0 UR6 27IN ABS (SUTURE) ×2 IMPLANT
SUT VLOC 180 2-0 9IN GS21 (SUTURE) ×2 IMPLANT
SUT VLOC BARB 180 ABS3/0GR12 (SUTURE) ×2
SUTURE V-LC BRB 180 2/0GR6GS22 (SUTURE) ×3 IMPLANT
SUTURE VLOC BRB 180 ABS3/0GR12 (SUTURE) IMPLANT
TOWEL OR NON WOVEN STRL DISP B (DISPOSABLE) ×2 IMPLANT
TRAY FOLEY W/METER SILVER 16FR (SET/KITS/TRAYS/PACK) ×1 IMPLANT
TRAY LAPAROSCOPIC (CUSTOM PROCEDURE TRAY) ×2 IMPLANT
TROCAR XCEL 12X100 BLDLESS (ENDOMECHANICALS) IMPLANT
WATER STERILE IRR 1000ML POUR (IV SOLUTION) ×3 IMPLANT

## 2018-01-29 NOTE — Transfer of Care (Signed)
Immediate Anesthesia Transfer of Care Note  Patient: Julia Boyer  Procedure(s) Performed: ROBOTIC ASSITED RIGHT PARTIAL NEPHRECTOMY (Right Abdomen)  Patient Location: PACU  Anesthesia Type:General  Level of Consciousness: awake, alert  and oriented  Airway & Oxygen Therapy: Patient Spontanous Breathing and Patient connected to face mask oxygen  Post-op Assessment: Report given to RN and Post -op Vital signs reviewed and stable  Post vital signs: Reviewed and stable  Last Vitals:  Vitals:   01/29/18 1014 01/29/18 1650  BP: 133/87 (P) 127/79  Pulse: 94   Resp: 18   Temp: 36.9 C   SpO2: 99%     Last Pain:  Vitals:   01/29/18 1014  TempSrc: Oral         Complications: No apparent anesthesia complications

## 2018-01-29 NOTE — Op Note (Addendum)
Operative Note  Preoperative diagnosis:  1.  5 cm right renal mass  Postoperative diagnosis: 1.  5 cm right renal mass  Procedure(s): 1.  Robotic assisted laparoscopic right partial nephrectomy 2.  Intraoperative ultrasound of single retroperitoneal organ  Surgeon: Ellison Hughs, MD  Assistants:  Debbrah Alar, NP An assistant was required for this surgical procedure.  The duties of the assistant included but were not limited to suctioning, passing suture, camera manipulation, retraction.  This procedure would not be able to be performed without an Environmental consultant.   Resident: Jonna Clark, MD  Anesthesia: General endotracheal  Complications: None  EBL:  150 mL  Fluids: 2000 mL crystalloid   Specimens: 1.  Right renal mass  Drains/Catheters: 1.  16 French Foley catheter 2.  Right lower quadrant JP drain  Intraoperative findings:  1.  5 cm right renal mass 2.  Renorrhaphy was hemostatic at the conclusion of the case  Indication:  Julia Boyer is a 42 y.o. female with a heterogenous 5 cm right renal mass seen on recent CT which was initially ordered to evaluate left sided-flank/abdominal pain. Long smoking history--states that she started smoking 1 ppd in her teens. She denies right flank pain, gross hematuria or any urinary filling/voiding issues. No personal/family history of GU malignancies.  The risk, benefits and alternatives of the after mentioned procedures was discussed with the patient.  She voices understanding and wishes to proceed.  Description of procedure:  After informed consent was signed, the patient was taken back to the operating room and properly anesthetized.  The patient was then placed in the left lateral decubitus position with all pressure points padded.  The abdomen was then prepped and draped in the usual sterile fashion.  A time-out was then performed.    An 8 mm incision was then made lateral to the right rectus muscle at the level of the  right 12th rib.  A Veress needle was then used to access the abdominal cavity.  A saline drop test showed no signs of obstruction and aspiration of the Veress needle revealed no blood or sucus.  The abdominal cavity was then insufflated to 15 mmHg.  An 8 mm robotic trocar was then atraumatically inserted into the abdominal cavity.  The robotic camera was then inserted through the port and inspection of the abdominal cavity revealed no evidence of adjacent organ or vessel injury.  We then placed two additional 8 mm robotic ports, a 15 mm assistant port and a 5 mm subxiphoid port in such as fashion as to triangulate the right renal hilum.  The robot was then docked into postion.   Using a combination of blunt and cold scissors dissection, the hepatic attachments were released from the abdominal sidewall.  A locking grasper was then inserted through the 5 mm sub-xyphoid port and used to retract the posterior surface of the liver more cephalad.  The white line of Toldt along the ascending colon was then incised, allowing [place] to reflect the colon medially and expose the anterior surface of the right kidney.  The duodenum was then Kocherized medially, which abruptly led [place] to identification of the inferior vena cava.    Once the colon was adequately mobilized, we moved to the lower pole and identified the gonadal vein and ureter.  The gonadal vein was then left running parallel to the vena cava and the right ureter was reflected anteriorly.  Using cautious cautery, the overlying perihilar attachments were then released.  This yielded visualization of  the renal hilum, which included a single right renal vein and a single right renal artery.  The perilymphatic tissue surrounding the right renal artery were carefully released so that the right renal artery was fully encircled.    We next turned our attention to defatting the kidney.  An anterior incision along Gerota's fascia was created and the kidney was fully  mobilized.   The renal mass is identified at the right mid-lower pole.  Using intraoperative ultrasound, the tumor was then carefully evaluated and demonstrated heterogenous echogenicity compared to the rest of the renal parenchyma. The resection margin was marked to allow for wide excision of the renal mass.  The renal hilum was once again identified and a bulldog clamp was placed.    Using cold scissors, the right renal mass was then carefully excised, leaving a grossly negative margin.  Excision of the mass appeared complete with no tumor grossly remaining.  The tumor was then placed in the right lower quadrant, to be retrieved following repair of the renal defect.  During the resection, it was noted that a 2 cm portio of the lower pole collecting system was entered.  This was reapproximated with a running 3-0 Vicryl.  A running 2-0 V-lock fix suture was then used to reapproximate the resection bed.  Tension was placed with hemo-lock clips.   The right renal capsule was then reapproximated using a 1-0 Vicryl suture on a CT1 needle in a horizontal mattress configuration, using hemo-lock clips as a buttress.  The bulldog was then released, which warm ischemia time totaled 28 minutes.   Once hemostasis was achieved and the renal bed was irrigated, surgiflow was applied over the renorrhaphy.  Gerota's fascia was then reapproximated with a running v-lok suture and the mesocolonic fat along the descending colon was then reapproximated to the right abdominal sidewall using Hem-o-lok clips.  The renal mass was retrieved and placed in an Endo Catch bag.  The mass was extracted through the 15 mm assistant port.  The fascia of the  15 mm assistant port was then reapproximated with a 0 Vicryl suture.     The abdomen was desufflated with all ports removed.  The skin was then reapproximated using skin staples and dressed appropriately.  The patient was then replaced in the supine position and was awakened from anesthesia  without complications.  Warm Ischemia Time: 28 minutes RENAL Score: 7A  Plan: Bedrest this evening.  Advance diet as tolerated.  Start heparin in the morning and begin ambulation on POD 1.  D/c JP drain prior to discharge if output appropriate.

## 2018-01-29 NOTE — Discharge Instructions (Signed)

## 2018-01-29 NOTE — Anesthesia Postprocedure Evaluation (Signed)
Anesthesia Post Note  Patient: Yates Decamp  Procedure(s) Performed: ROBOTIC ASSITED RIGHT PARTIAL NEPHRECTOMY (Right Abdomen)     Patient location during evaluation: PACU Anesthesia Type: General Level of consciousness: awake and alert Pain management: pain level controlled Vital Signs Assessment: post-procedure vital signs reviewed and stable Respiratory status: spontaneous breathing, nonlabored ventilation, respiratory function stable and patient connected to nasal cannula oxygen Cardiovascular status: blood pressure returned to baseline and stable Postop Assessment: no apparent nausea or vomiting Anesthetic complications: no    Last Vitals:  Vitals:   01/29/18 1700 01/29/18 1715  BP: 106/65 129/85  Pulse: 100 (!) 104  Resp: 15 14  Temp:    SpO2: 95% 96%    Last Pain:  Vitals:   01/29/18 1715  TempSrc:   PainSc: Asleep                 Cayetano Mikita

## 2018-01-29 NOTE — Anesthesia Preprocedure Evaluation (Signed)
Anesthesia Evaluation  Patient identified by MRN, date of birth, ID band Patient awake    Reviewed: Allergy & Precautions, NPO status , Patient's Chart, lab work & pertinent test results  History of Anesthesia Complications Negative for: history of anesthetic complications  Airway Mallampati: II  TM Distance: >3 FB Neck ROM: Full    Dental no notable dental hx. (+) Dental Advisory Given   Pulmonary neg shortness of breath, asthma , Current Smoker, former smoker,    Pulmonary exam normal        Cardiovascular negative cardio ROS Normal cardiovascular exam     Neuro/Psych  Headaches, negative psych ROS   GI/Hepatic negative GI ROS, Neg liver ROS,   Endo/Other  Morbid obesity  Renal/GU negative Renal ROS     Musculoskeletal negative musculoskeletal ROS (+)   Abdominal   Peds  Hematology negative hematology ROS (+)   Anesthesia Other Findings Day of surgery medications reviewed with the patient.  Reproductive/Obstetrics negative OB ROS                             Anesthesia Physical  Anesthesia Plan  ASA: III  Anesthesia Plan: General   Post-op Pain Management:    Induction: Intravenous  PONV Risk Score and Plan: 3 and Ondansetron, Dexamethasone, Scopolamine patch - Pre-op and Treatment may vary due to age or medical condition  Airway Management Planned: Oral ETT  Additional Equipment:   Intra-op Plan:   Post-operative Plan: Extubation in OR  Informed Consent: I have reviewed the patients History and Physical, chart, labs and discussed the procedure including the risks, benefits and alternatives for the proposed anesthesia with the patient or authorized representative who has indicated his/her understanding and acceptance.   Dental advisory given  Plan Discussed with: CRNA, Anesthesiologist and Surgeon  Anesthesia Plan Comments:         Anesthesia Quick  Evaluation

## 2018-01-29 NOTE — Anesthesia Procedure Notes (Signed)
Procedure Name: Intubation Date/Time: 01/29/2018 12:46 PM Performed by: Maxwell Caul, CRNA Pre-anesthesia Checklist: Patient identified, Emergency Drugs available, Suction available and Patient being monitored Patient Re-evaluated:Patient Re-evaluated prior to induction Oxygen Delivery Method: Circle system utilized Preoxygenation: Pre-oxygenation with 100% oxygen Induction Type: IV induction Ventilation: Mask ventilation without difficulty Laryngoscope Size: Mac and 4 Grade View: Grade I Tube type: Oral Tube size: 7.5 mm Number of attempts: 1 Airway Equipment and Method: Stylet Placement Confirmation: ETT inserted through vocal cords under direct vision,  positive ETCO2 and breath sounds checked- equal and bilateral Tube secured with: Tape Dental Injury: Teeth and Oropharynx as per pre-operative assessment

## 2018-01-30 ENCOUNTER — Other Ambulatory Visit: Payer: Self-pay

## 2018-01-30 ENCOUNTER — Encounter (HOSPITAL_COMMUNITY): Payer: Self-pay | Admitting: Urology

## 2018-01-30 LAB — BASIC METABOLIC PANEL
Anion gap: 7 (ref 5–15)
BUN: 15 mg/dL (ref 6–20)
CHLORIDE: 104 mmol/L (ref 101–111)
CO2: 24 mmol/L (ref 22–32)
CREATININE: 1.18 mg/dL — AB (ref 0.44–1.00)
Calcium: 8.6 mg/dL — ABNORMAL LOW (ref 8.9–10.3)
GFR calc Af Amer: >60 mL/min (ref >60)
GFR calc non Af Amer: 56 mL/min — ABNORMAL LOW (ref >60)
Glucose, Bld: 139 mg/dL — ABNORMAL HIGH (ref 65–99)
POTASSIUM: 3.9 mmol/L (ref 3.5–5.1)
SODIUM: 135 mmol/L (ref 135–145)

## 2018-01-30 LAB — CBC
HCT: 38.6 % (ref 36.0–46.0)
Hemoglobin: 12.7 g/dL (ref 12.0–15.0)
MCH: 32.2 pg (ref 26.0–34.0)
MCHC: 32.9 g/dL (ref 30.0–36.0)
MCV: 98 fL (ref 78.0–100.0)
Platelets: 258 10*3/uL (ref 150–400)
RBC: 3.94 MIL/uL (ref 3.87–5.11)
RDW: 12.7 % (ref 11.5–15.5)
WBC: 17.2 10*3/uL — AB (ref 4.0–10.5)

## 2018-01-30 LAB — GLUCOSE, CAPILLARY: Glucose-Capillary: 126 mg/dL — ABNORMAL HIGH (ref 65–99)

## 2018-01-30 MED ORDER — ACETAMINOPHEN 325 MG PO TABS
650.0000 mg | ORAL_TABLET | ORAL | Status: DC
  Administered 2018-01-30 – 2018-01-31 (×5): 650 mg via ORAL
  Filled 2018-01-30 (×7): qty 2

## 2018-01-30 MED ORDER — PNEUMOCOCCAL VAC POLYVALENT 25 MCG/0.5ML IJ INJ
0.5000 mL | INJECTION | INTRAMUSCULAR | Status: AC
  Administered 2018-01-31: 0.5 mL via INTRAMUSCULAR
  Filled 2018-01-30: qty 0.5

## 2018-01-30 NOTE — Progress Notes (Signed)
Resumed care of pt. Agree with previous RN's assessments. Will continue to follow with plan of care for pt.

## 2018-01-30 NOTE — Progress Notes (Signed)
Urology Progress Note   1 Day Post-Op  Subjective: 42 yo s/p robotic right partial nephrectomy for renal mass 2/15.   Did well overnight. VSS, slight downtrend in BP this AM however MAPs > 60s. Asymptomatic. Excellent UOP, minimal output from drain. Slight AKI this AM, leukocytisis expected post surgically. Hgb 12.4 from 14.4.   Objective: Vital signs in last 24 hours: Temp:  [97.7 F (36.5 C)-98.8 F (37.1 C)] 98.2 F (36.8 C) (02/16 0947) Pulse Rate:  [73-110] 73 (02/16 0947) Resp:  [12-18] 16 (02/16 0947) BP: (90-129)/(56-85) 100/56 (02/16 0947) SpO2:  [94 %-97 %] 96 % (02/16 0947)  Intake/Output from previous day: 02/15 0701 - 02/16 0700 In: 3621.7 [P.O.:120; I.V.:3201.7; IV Piggyback:300] Out: 9163 [Urine:1350; Drains:68; Blood:150] Intake/Output this shift: Total I/O In: 191.7 [I.V.:191.7] Out: 280 [Urine:250; Drains:30]  Physical Exam:  General: Alert and oriented CV: RRR Lungs: Clear Abdomen: Soft, appropriately distended and tender. Incisions clean dry and intact with staplew in place. JP drain with serosanguinous drainage in bulb  GU: Foley with clear yellow urine in tubing  Ext: NT, No erythema  Lab Results: Recent Labs    01/29/18 1656 01/30/18 0408  HGB 14.4 12.7  HCT 42.7 38.6   BMET Recent Labs    01/30/18 0410  NA 135  K 3.9  CL 104  CO2 24  GLUCOSE 139*  BUN 15  CREATININE 1.18*  CALCIUM 8.6*     Studies/Results: No results found.  Assessment/Plan:  42 y.o. female s/p R robotic partial nephrectomy for renal mass 2/15, progressing well - off bed rest, ambulate and OOB to chair today - regular diet, med lock - remove foley catheter, TOV - recheck labs, JP cr in AM to monitor kidney function and acute blood loss anemia  - SQH DVT ppx  Dispo: tomorrow if continues to progress well    LOS: 1 day   Aurthur Wingerter L 01/30/2018, 10:35 AM

## 2018-01-31 LAB — BASIC METABOLIC PANEL
Anion gap: 8 (ref 5–15)
BUN: 14 mg/dL (ref 6–20)
CALCIUM: 8.3 mg/dL — AB (ref 8.9–10.3)
CO2: 26 mmol/L (ref 22–32)
CREATININE: 1.27 mg/dL — AB (ref 0.44–1.00)
Chloride: 101 mmol/L (ref 101–111)
GFR calc Af Amer: 59 mL/min — ABNORMAL LOW (ref >60)
GFR, EST NON AFRICAN AMERICAN: 51 mL/min — AB (ref >60)
Glucose, Bld: 128 mg/dL — ABNORMAL HIGH (ref 65–99)
POTASSIUM: 3.9 mmol/L (ref 3.5–5.1)
SODIUM: 135 mmol/L (ref 135–145)

## 2018-01-31 LAB — CBC
HCT: 37.9 % (ref 36.0–46.0)
Hemoglobin: 12.2 g/dL (ref 12.0–15.0)
MCH: 31.6 pg (ref 26.0–34.0)
MCHC: 32.2 g/dL (ref 30.0–36.0)
MCV: 98.2 fL (ref 78.0–100.0)
PLATELETS: 230 10*3/uL (ref 150–400)
RBC: 3.86 MIL/uL — ABNORMAL LOW (ref 3.87–5.11)
RDW: 13 % (ref 11.5–15.5)
WBC: 15 10*3/uL — AB (ref 4.0–10.5)

## 2018-01-31 LAB — CREATININE, FLUID (PLEURAL, PERITONEAL, JP DRAINAGE): CREAT FL: 1.3 mg/dL

## 2018-01-31 NOTE — Progress Notes (Signed)
Discussed with patient and family discharge instructions, all verbalized agreement and understanding.  Patient left with all belongings to go home in private vehicle.

## 2018-01-31 NOTE — Discharge Summary (Signed)
[facility] Discharge Summary  Admit date: 01/29/2018  Discharge date and time: 01/31/18   Discharge to: Home  Discharge Service: Urology  Discharge Attending Physician:  Dr. Nicolette Bang   Discharge  Diagnoses: Renal mass   Secondary Diagnosis: Active Problems:   Renal mass   OR Procedures: Procedure(s): ROBOTIC ASSITED RIGHT PARTIAL NEPHRECTOMY 01/29/2018   Ancillary Procedures: None   Discharge Day Services: The patient was seen and examined by the Urology team both in the morning and immediately prior to discharge.  Vital signs and laboratory values were stable and within normal limits.  The physical exam was benign and unchanged and all surgical wounds were examined.  Discharge instructions were explained and all questions answered.  Subjective  No acute events overnight. Pain Controlled. No fever or chills.  Objective Patient Vitals for the past 8 hrs:  BP Temp Temp src Pulse Resp SpO2  01/31/18 0442 112/67 99.5 F (37.5 C) Oral 100 20 93 %   Total I/O In: 240 [P.O.:240] Out: -   General Appearance:        No acute distress Lungs:                 Normal work of breathing on room air Heart:                             Regular rate and rhythm Abdomen:                       Soft, non-tender, non-distended. Incisions clean dry and intact, staples in place. JP drain site dressed appropirately.  Extremities:                  Warm and well perfused    Hospital Course:  The patient underwent right robotic partial nephrectomy on 01/29/2018.  The patient tolerated the procedure well, was extubated in the OR, and afterwards was taken to the PACU for routine post-surgical care. When stable the patient was transferred to the floor.   The patient did well postoperatively.  The patient's diet was slowly advanced and at the time of discharge was tolerating a regular diet. Foley catheter was removed on POD1 and patient passed TOV. JP put out minimal serosanguinous drainage  throughout hospitalization, and on POD2 was sent for a JP Cr. This returned 1.3, and the drain was removed prior to discharge.  The patient was discharged home 2 Days Post-Op, at which point was tolerating a regular solid diet, was able to void spontaneously, have adequate pain control with P.O. pain medication, and could ambulate without difficulty. The patient will follow up with [place] for post op check.   Condition at Discharge: Improved  Discharge Medications:   Allergies as of 01/31/2018      Reactions   Codeine Nausea And Vomiting   Not sure if she can take oxycodone or hydrocodone   Doxycycline Nausea And Vomiting      Medication List    TAKE these medications   albuterol 108 (90 Base) MCG/ACT inhaler Commonly known as:  PROVENTIL HFA;VENTOLIN HFA Inhale 2 puffs into the lungs every 6 (six) hours as needed for wheezing or shortness of breath.   docusate sodium 100 MG capsule Commonly known as:  COLACE Take 1 capsule (100 mg total) by mouth 2 (two) times daily.   FLUoxetine 10 MG tablet Commonly known as:  PROZAC Take 10 mg by mouth at bedtime.  Fluticasone-Salmeterol 250-50 MCG/DOSE Aepb Commonly known as:  ADVAIR Inhale 1 puff into the lungs every 12 (twelve) hours.   HYDROcodone-acetaminophen 5-325 MG tablet Commonly known as:  NORCO Take 1-2 tablets by mouth every 6 (six) hours as needed for moderate pain or severe pain.   ondansetron 4 MG tablet Commonly known as:  ZOFRAN Take 1 tablet (4 mg total) by mouth every 8 (eight) hours as needed for nausea or vomiting.   varenicline 0.5 MG tablet Commonly known as:  CHANTIX Take 0.5 mg by mouth 2 (two) times daily.       Pending Test Results: Surgical pathology  Discharge Instructions:  Discharge Instructions    Call MD for:   Complete by:  As directed    Significant blood in urine   Call MD for:  persistant nausea and vomiting   Complete by:  As directed    Call MD for:  redness, tenderness, or signs of  infection (pain, swelling, redness, odor or green/yellow discharge around incision site)   Complete by:  As directed    Call MD for:  severe uncontrolled pain   Complete by:  As directed    Call MD for:  temperature >100.4   Complete by:  As directed    Diet - low sodium heart healthy   Complete by:  As directed    Discharge instructions   Complete by:  As directed    Activity:  You are encouraged to ambulate frequently (about every hour during waking hours) to help prevent blood clots from forming in your legs or lungs.  However, you should not engage in any heavy lifting (> 10-15 lbs), strenuous activity, or straining. Diet: You should advance your diet as instructed by your physician.  It will be normal to have some bloating, nausea, and abdominal discomfort intermittently. Prescriptions:  You will be provided a prescription for pain medication to take as needed.  If your pain is not severe enough to require the prescription pain medication, you may take extra strength Tylenol instead which will have less side effects.  You should also take a prescribed stool softener to avoid straining with bowel movements as the prescription pain medication may constipate you. Incisions: You may remove your dressing bandages 48 hours after surgery if not removed in the hospital.  You will either have some small staples or special tissue glue at each of the incision sites. Once the bandages are removed (if present), the incisions may stay open to air.  You may start showering (but not soaking or bathing in water) the 2nd day after surgery and the incisions simply need to be patted dry after the shower.  No additional care is needed. What to call us about: You should call the office [phone]) if you develop fever > 101 or develop persistent vomiting.   Activity:  You are encouraged to ambulate frequently (about every hour during waking hours) to help prevent blood clots from forming in your legs or lungs.   However, you should not engage in any heavy lifting (> 10-15 lbs), strenuous activity, or straining. Diet: You should advance your diet as instructed by your physician.  It will be normal to have some bloating, nausea, and abdominal discomfort intermittently. Prescriptions:  You will be provided a prescription for pain medication to take as needed.  If your pain is not severe enough to require the prescription pain medication, you may take extra strength Tylenol instead which will have less side effects.  You should also  take a prescribed stool softener to avoid straining with bowel movements as the prescription pain medication may constipate you. Incisions: You may remove your dressing bandages 48 hours after surgery if not removed in the hospital.  You will either have some small staples or special tissue glue at each of the incision sites. Once the bandages are removed (if present), the incisions may stay open to air.  You may start showering (but not soaking or bathing in water) the 2nd day after surgery and the incisions simply need to be patted dry after the shower.  No additional care is needed. What to call us about: You should call the office [phone]) if you develop fever > 101 or develop persistent vomiting.   Driving Restrictions   Complete by:  As directed    No driving while taking narcotic medicine.   Lifting restrictions   Complete by:  As directed    10-15 pounds for 4-6 weeks.   Remove dressing in 48 hours   Complete by:  As directed    Remove drain site dressing in 48 hours. Change as needed when soiled.     Follow up appointment  Follow-up Information    Ceasar Mons, MD Follow up on 02/05/2018.   Specialty:  Urology Why:  at 8:45 Contact information: [address] [place] [address] [phone]

## 2018-09-27 ENCOUNTER — Ambulatory Visit (HOSPITAL_COMMUNITY)
Admission: RE | Admit: 2018-09-27 | Discharge: 2018-09-27 | Disposition: A | Discharge status: Home / Self Care | Payer: BLUE CROSS/BLUE SHIELD | Source: Ambulatory Visit | Attending: Urology | Admitting: Urology

## 2018-09-27 ENCOUNTER — Other Ambulatory Visit (HOSPITAL_COMMUNITY): Payer: Self-pay | Admitting: Urology

## 2018-09-27 DIAGNOSIS — C641 Malignant neoplasm of right kidney, except renal pelvis: Secondary | ICD-10-CM | POA: Insufficient documentation

## 2018-12-31 ENCOUNTER — Other Ambulatory Visit: Payer: Self-pay | Admitting: Adult Health Nurse Practitioner

## 2018-12-31 DIAGNOSIS — Z1231 Encounter for screening mammogram for malignant neoplasm of breast: Secondary | ICD-10-CM

## 2019-01-19 ENCOUNTER — Ambulatory Visit
Admission: RE | Admit: 2019-01-19 | Discharge: 2019-01-19 | Disposition: A | Discharge status: Home / Self Care | Payer: PRIVATE HEALTH INSURANCE | Source: Ambulatory Visit

## 2019-01-19 DIAGNOSIS — Z1231 Encounter for screening mammogram for malignant neoplasm of breast: Secondary | ICD-10-CM

## 2019-01-20 ENCOUNTER — Other Ambulatory Visit: Payer: Self-pay | Admitting: Adult Health Nurse Practitioner

## 2019-01-20 DIAGNOSIS — R928 Other abnormal and inconclusive findings on diagnostic imaging of breast: Secondary | ICD-10-CM

## 2019-01-25 ENCOUNTER — Ambulatory Visit
Admission: RE | Admit: 2019-01-25 | Discharge: 2019-01-25 | Disposition: A | Discharge status: Home / Self Care | Payer: PRIVATE HEALTH INSURANCE | Source: Ambulatory Visit | Attending: Adult Health Nurse Practitioner | Admitting: Adult Health Nurse Practitioner

## 2019-01-25 ENCOUNTER — Other Ambulatory Visit: Payer: Self-pay | Admitting: Adult Health Nurse Practitioner

## 2019-01-25 DIAGNOSIS — R928 Other abnormal and inconclusive findings on diagnostic imaging of breast: Secondary | ICD-10-CM

## 2019-01-25 DIAGNOSIS — N6310 Unspecified lump in the right breast, unspecified quadrant: Secondary | ICD-10-CM

## 2019-02-01 ENCOUNTER — Ambulatory Visit
Admission: RE | Admit: 2019-02-01 | Discharge: 2019-02-01 | Disposition: A | Discharge status: Home / Self Care | Payer: PRIVATE HEALTH INSURANCE | Source: Ambulatory Visit | Attending: Adult Health Nurse Practitioner | Admitting: Adult Health Nurse Practitioner

## 2019-02-01 DIAGNOSIS — N6310 Unspecified lump in the right breast, unspecified quadrant: Secondary | ICD-10-CM

## 2019-11-22 ENCOUNTER — Other Ambulatory Visit (HOSPITAL_COMMUNITY): Payer: Self-pay | Admitting: Urology

## 2019-11-22 ENCOUNTER — Ambulatory Visit (HOSPITAL_COMMUNITY)
Admission: RE | Admit: 2019-11-22 | Discharge: 2019-11-22 | Disposition: A | Discharge status: Home / Self Care | Payer: PRIVATE HEALTH INSURANCE | Source: Ambulatory Visit | Attending: Urology | Admitting: Urology

## 2019-11-22 ENCOUNTER — Other Ambulatory Visit: Payer: Self-pay

## 2019-11-22 DIAGNOSIS — C641 Malignant neoplasm of right kidney, except renal pelvis: Secondary | ICD-10-CM | POA: Diagnosis not present

## 2020-02-10 IMAGING — DX DG CHEST 2V
2 series · 2 of 2 positions shown · non-contrast
Comparison: 09/28/2019

CLINICAL DATA: Renal cell carcinoma

EXAM:
CHEST - 2 VIEW

[chest pa]
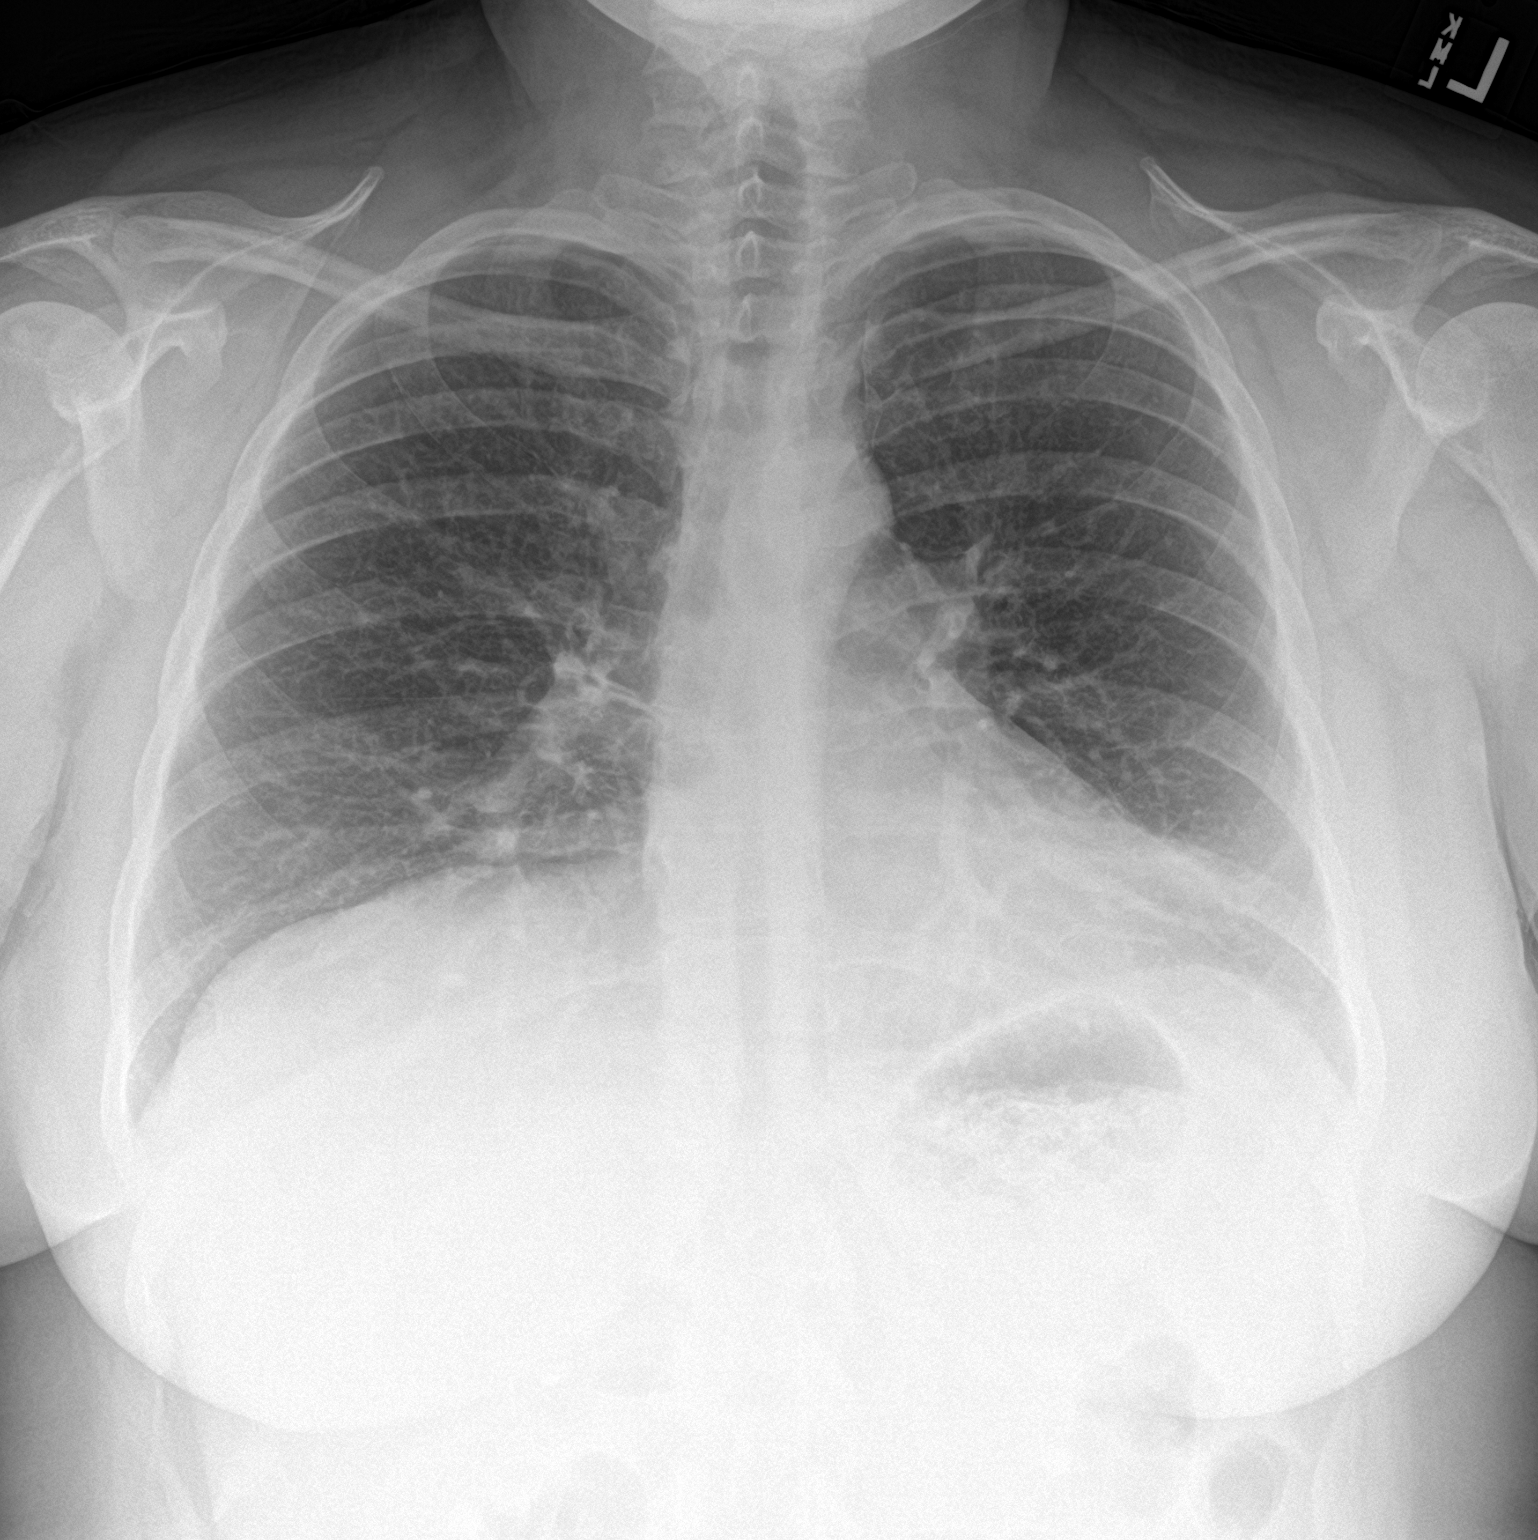

[chest lat]
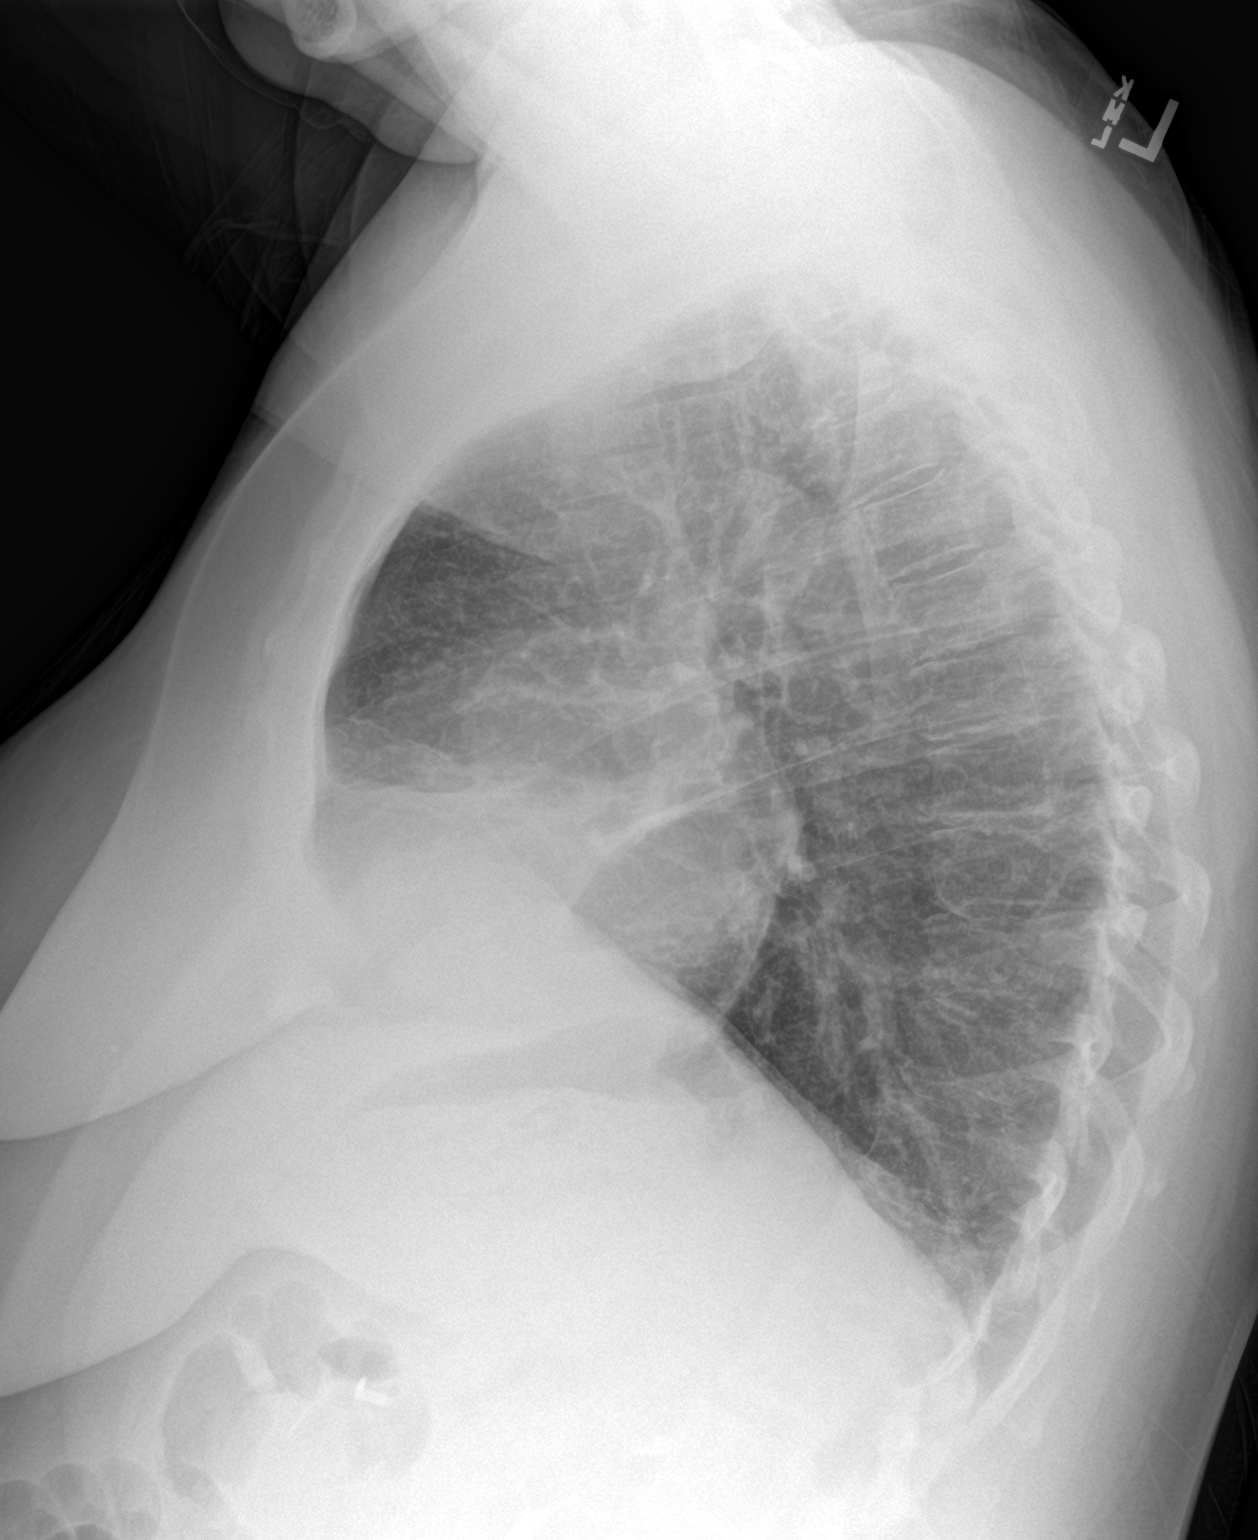

[2 of 2 positions shown; findings below may reference images not displayed]

FINDINGS: The heart size and mediastinal contours are within normal limits.
Both lungs are clear. The visualized skeletal structures are
unremarkable.
IMPRESSION: No acute abnormality of the lungs. No radiographic evidence of
pulmonary metastatic disease.

## 2021-01-15 ENCOUNTER — Other Ambulatory Visit: Payer: Self-pay | Admitting: Adult Health Nurse Practitioner

## 2021-01-15 DIAGNOSIS — N63 Unspecified lump in unspecified breast: Secondary | ICD-10-CM

## 2021-01-15 DIAGNOSIS — N6489 Other specified disorders of breast: Secondary | ICD-10-CM

## 2021-02-01 ENCOUNTER — Other Ambulatory Visit: Payer: PRIVATE HEALTH INSURANCE

## 2021-02-15 ENCOUNTER — Ambulatory Visit
Admission: RE | Admit: 2021-02-15 | Discharge: 2021-02-15 | Disposition: A | Discharge status: Home / Self Care | Payer: BC Managed Care – PPO | Source: Ambulatory Visit | Attending: Adult Health Nurse Practitioner | Admitting: Adult Health Nurse Practitioner

## 2021-02-15 ENCOUNTER — Ambulatory Visit
Admission: RE | Admit: 2021-02-15 | Discharge: 2021-02-15 | Disposition: A | Discharge status: Home / Self Care | Payer: PRIVATE HEALTH INSURANCE | Source: Ambulatory Visit | Attending: Adult Health Nurse Practitioner | Admitting: Adult Health Nurse Practitioner

## 2021-02-15 ENCOUNTER — Other Ambulatory Visit: Payer: Self-pay

## 2021-02-15 DIAGNOSIS — N6489 Other specified disorders of breast: Secondary | ICD-10-CM

## 2021-05-06 IMAGING — MG DIGITAL DIAGNOSTIC BILAT W/ TOMO W/ CAD
8 series · 8 of 24 positions shown · non-contrast
Comparison: Previous exam(s).

CLINICAL DATA: 45-year-old female presenting for delayed follow-up
status post benign stereotactic biopsy of the right breast.
Pathology results indicated PASH and fibrocystic changes.

EXAM:
DIGITAL DIAGNOSTIC BILATERAL MAMMOGRAM WITH TOMOSYNTHESIS AND CAD;
ULTRASOUND RIGHT BREAST LIMITED
TECHNIQUE: Bilateral digital diagnostic mammography and breast tomosynthesis
was performed. The images were evaluated with computer-aided
detection.; Targeted ultrasound examination of the right breast was
performed

[R MLO synth-2D]
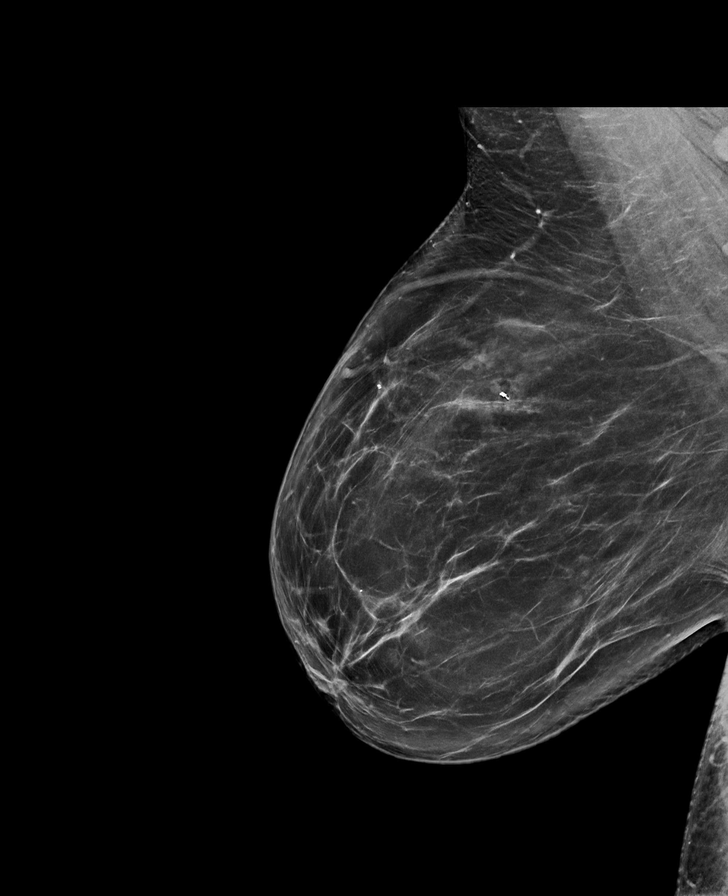

[R CC synth-2D]
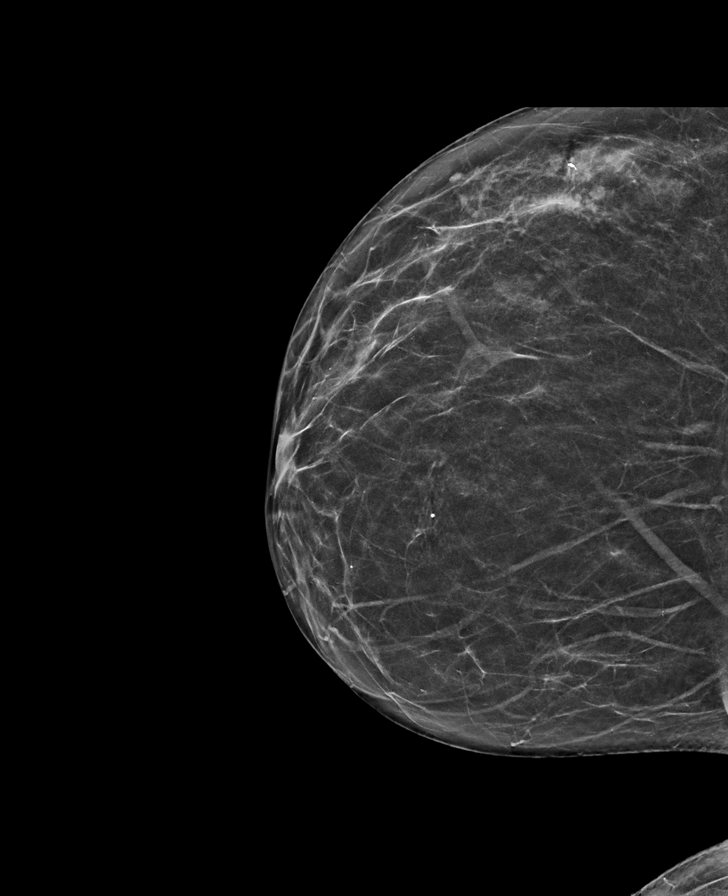

[L MLO synth-2D]
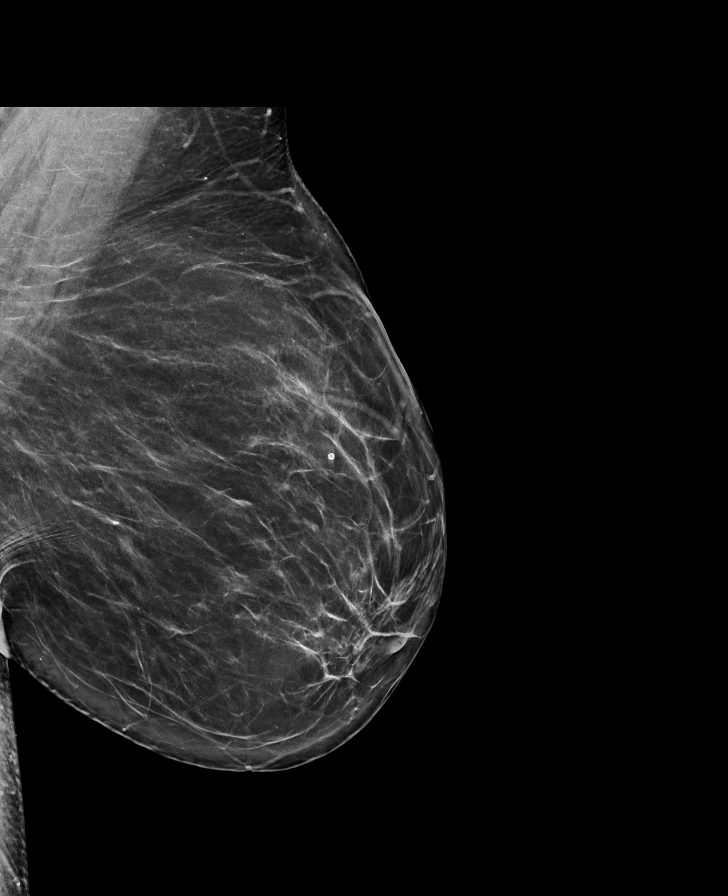

[L CC synth-2D]
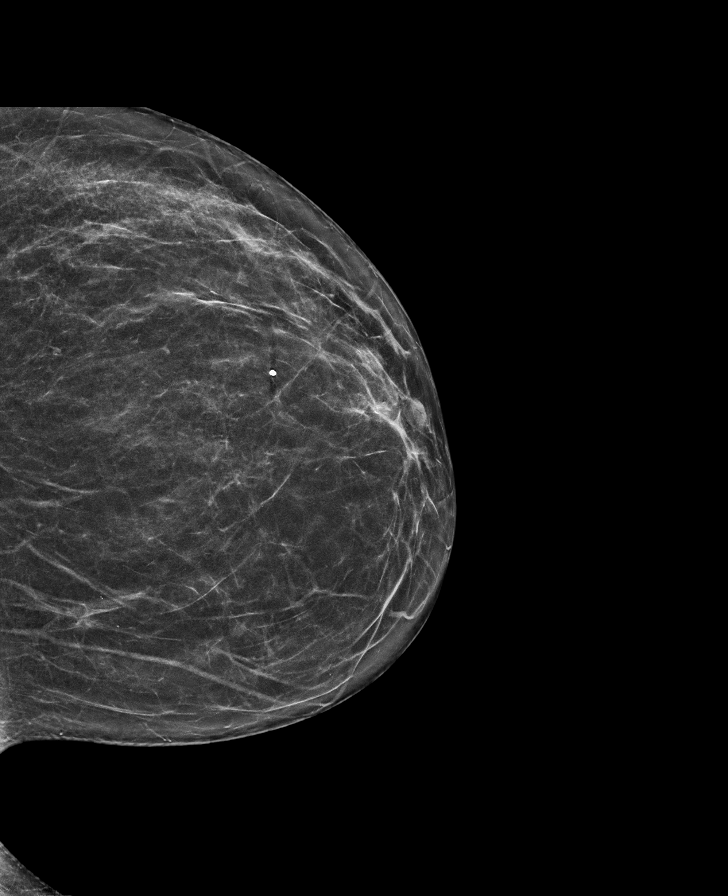

[R CC tomo · tomo slice 31/60.0]
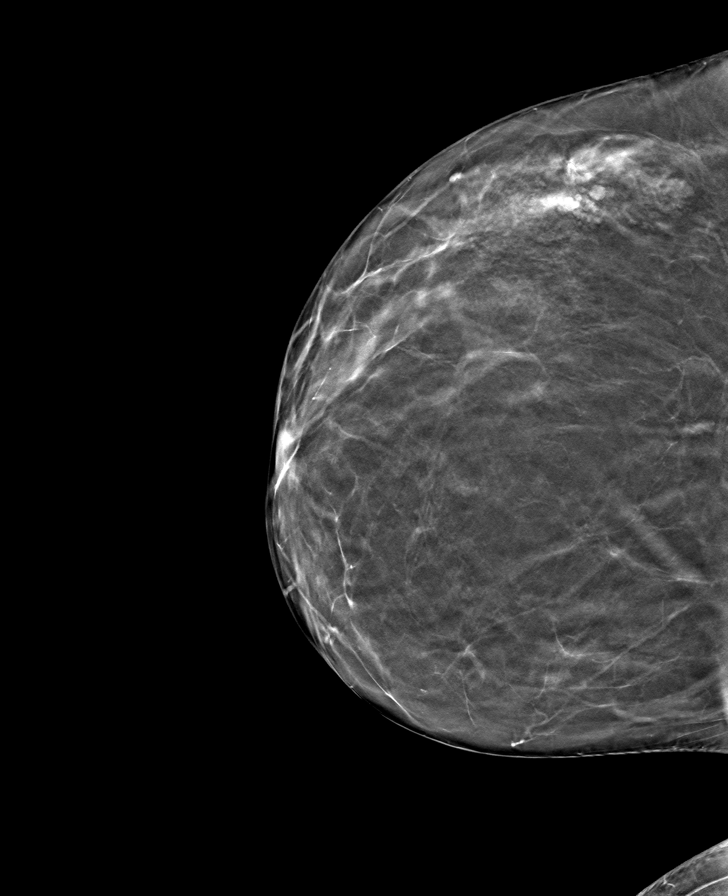

[R MLO tomo · tomo slice 46/91.0]
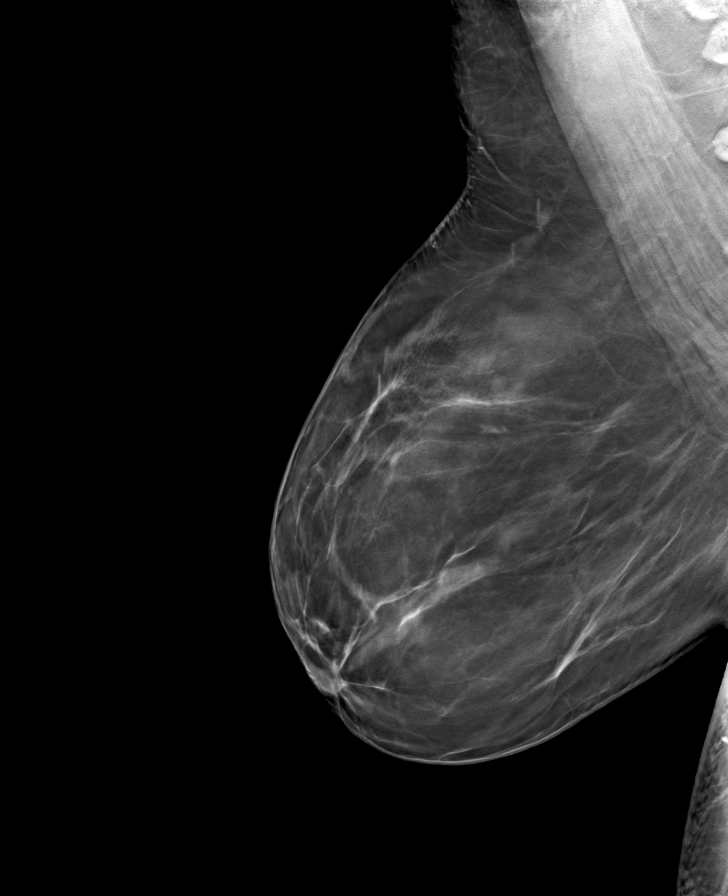

[L MLO tomo · tomo slice 45/88.0]
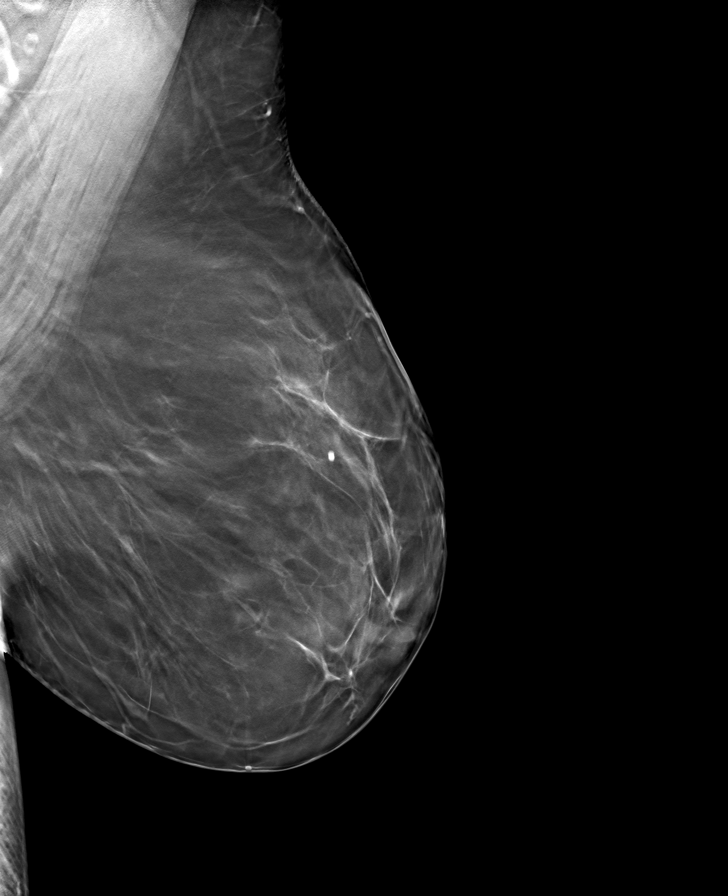

[L CC tomo · tomo slice 33/64.0]
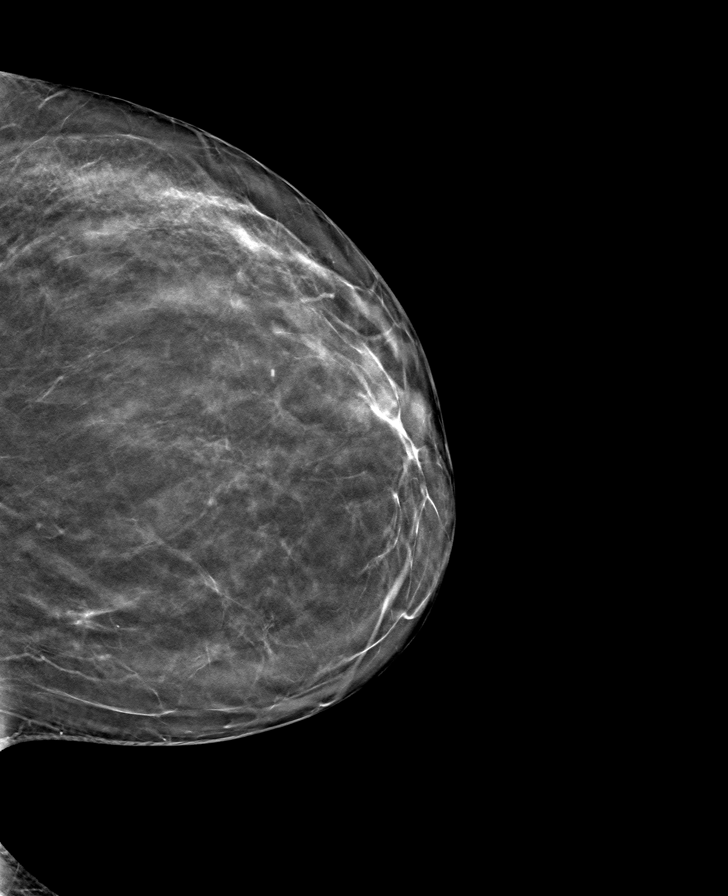

[8 of 24 positions shown; findings below may reference images not displayed]

ACR Breast Density Category b: There are scattered areas of
fibroglandular density.
FINDINGS: Multiple small oval masses are seen scattered surrounding the biopsy
marking clip in the upper-outer quadrant of the right breast. No
other suspicious calcifications, masses or areas of distortion are
seen in the bilateral breasts.

Ultrasound targeted to the upper-outer quadrant of the right breast
demonstrates a dense island of fibroglandular tissue with multiple
small scattered benign-appearing cysts.
IMPRESSION: 1. Fibrocystic changes are found at the biopsy site in the
upper-outer quadrant of the right breast.

2.  No mammographic evidence of malignancy in the bilateral breasts.

RECOMMENDATION:
Screening mammogram in one year.(Code:CU-[id number])

I have discussed the findings and recommendations with the patient.
If applicable, a reminder letter will be sent to the patient
regarding the next appointment.

BI-RADS CATEGORY  2: Benign.
